# Patient Record
Sex: Male | Born: 2000 | Race: White | Hispanic: No | Marital: Single | Smoking: Never smoker
Health system: Southern US, Community
[De-identification: ages and names within clinical notes are randomized; demographics above are authoritative.]

## PROBLEM LIST (undated history)

## (undated) ENCOUNTER — Encounter

## (undated) ENCOUNTER — Telehealth

## (undated) ENCOUNTER — Ambulatory Visit

## (undated) ENCOUNTER — Encounter: Attending: Medical | Primary: Medical

## (undated) ENCOUNTER — Ambulatory Visit: Payer: PRIVATE HEALTH INSURANCE

## (undated) ENCOUNTER — Encounter: Attending: Gastroenterology | Primary: Gastroenterology

## (undated) ENCOUNTER — Ambulatory Visit: Payer: PRIVATE HEALTH INSURANCE | Attending: Otolaryngology | Primary: Otolaryngology

## (undated) DIAGNOSIS — F458 Other somatoform disorders: Secondary | ICD-10-CM

## (undated) DIAGNOSIS — G40109 Localization-related (focal) (partial) symptomatic epilepsy and epileptic syndromes with simple partial seizures, not intractable, without status epilepticus: Secondary | ICD-10-CM

## (undated) DIAGNOSIS — R011 Cardiac murmur, unspecified: Secondary | ICD-10-CM

## (undated) DIAGNOSIS — R569 Unspecified convulsions: Secondary | ICD-10-CM

## (undated) DIAGNOSIS — K219 Gastro-esophageal reflux disease without esophagitis: Secondary | ICD-10-CM

## (undated) DIAGNOSIS — S62609A Fracture of unspecified phalanx of unspecified finger, initial encounter for closed fracture: Secondary | ICD-10-CM

## (undated) HISTORY — PX: ESOPHAGOGASTRODUODENOSCOPY ENDOSCOPY: SHX5814

## (undated) HISTORY — DX: Unspecified convulsions: R56.9

---
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
## 2018-04-16 MED ORDER — FLUTICASONE PROPIONATE (BULK) 100 % POWDER
11 refills | 0 days
Start: 2018-04-16 — End: ?

## 2018-08-02 ENCOUNTER — Encounter: Admit: 2018-08-02 | Discharge: 2018-08-02 | Payer: PRIVATE HEALTH INSURANCE

## 2018-08-02 DIAGNOSIS — R1310 Dysphagia, unspecified: Principal | ICD-10-CM

## 2018-08-02 MED ORDER — LIDOCAINE HCL 2 % MUCOSAL SOLUTION
Freq: Four times a day (QID) | OROMUCOSAL | 0 refills | 0 days | Status: CP | PRN
Start: 2018-08-02 — End: ?

## 2018-11-01 MED ORDER — OMEPRAZOLE 20 MG CAPSULE,DELAYED RELEASE
ORAL_CAPSULE | 3 refills | 0 days | Status: CP
Start: 2018-11-01 — End: ?

## 2018-11-15 ENCOUNTER — Ambulatory Visit: Admit: 2018-11-15 | Discharge: 2018-11-15 | Payer: PRIVATE HEALTH INSURANCE

## 2018-11-15 ENCOUNTER — Encounter
Admit: 2018-11-15 | Discharge: 2018-11-15 | Payer: PRIVATE HEALTH INSURANCE | Attending: Anesthesiology | Primary: Anesthesiology

## 2018-11-15 DIAGNOSIS — R1010 Upper abdominal pain, unspecified: Principal | ICD-10-CM

## 2018-11-15 DIAGNOSIS — K200 Eosinophilic esophagitis: Principal | ICD-10-CM

## 2018-11-15 DIAGNOSIS — R1310 Dysphagia, unspecified: Principal | ICD-10-CM

## 2018-11-15 DIAGNOSIS — K219 Gastro-esophageal reflux disease without esophagitis: Principal | ICD-10-CM

## 2018-11-15 DIAGNOSIS — K222 Esophageal obstruction: Principal | ICD-10-CM

## 2018-11-15 DIAGNOSIS — G40909 Epilepsy, unspecified, not intractable, without status epilepticus: Principal | ICD-10-CM

## 2018-11-20 DIAGNOSIS — G40909 Epilepsy, unspecified, not intractable, without status epilepticus: Principal | ICD-10-CM

## 2018-11-20 DIAGNOSIS — K222 Esophageal obstruction: Principal | ICD-10-CM

## 2018-11-20 DIAGNOSIS — R1010 Upper abdominal pain, unspecified: Principal | ICD-10-CM

## 2018-11-20 DIAGNOSIS — K219 Gastro-esophageal reflux disease without esophagitis: Principal | ICD-10-CM

## 2018-11-20 DIAGNOSIS — K200 Eosinophilic esophagitis: Principal | ICD-10-CM

## 2019-03-01 IMAGING — US US ABDOMEN COMPLETE
1 series · 14 of 25 positions shown · non-contrast
Comparison: None.

CLINICAL DATA: Mid umbilical pain abdominal pain

EXAM:
ABDOMEN ULTRASOUND COMPLETE

[Series 1: us abdomen complete · 0.14mm/px · 14 of 81 slices shown]
[im 1/81]
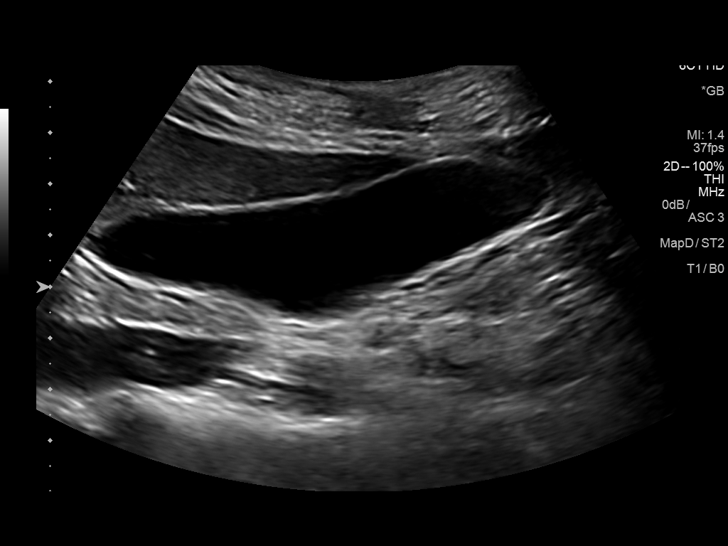
[im 7/81]
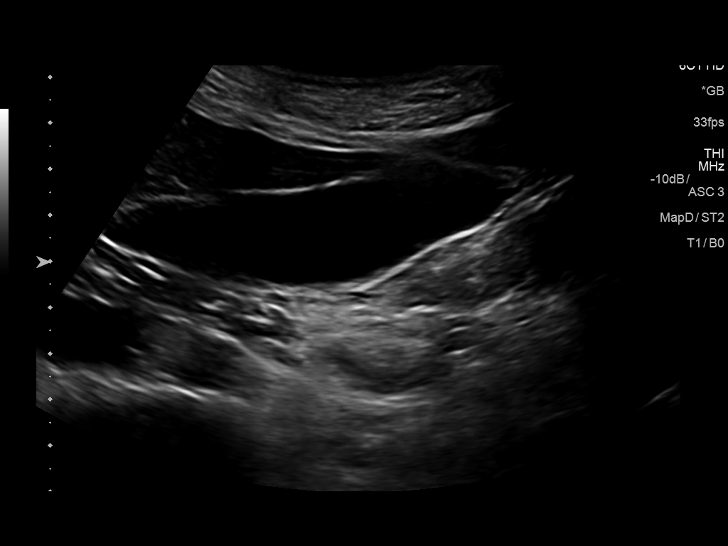
[im 14/81]
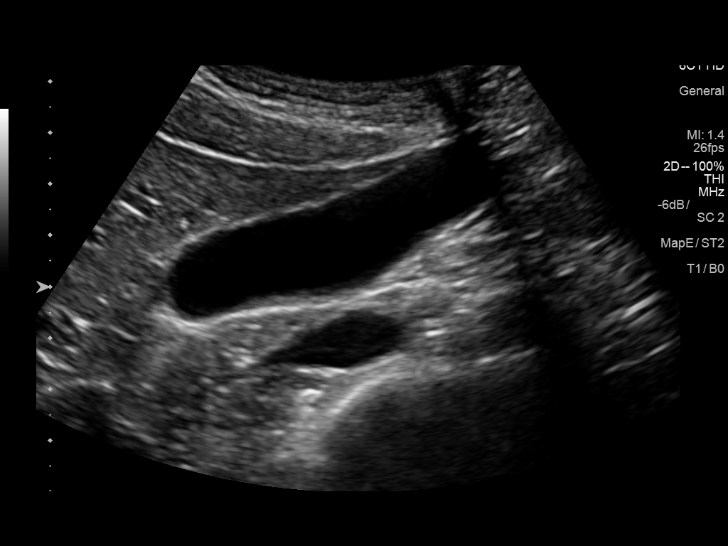
[im 21/81]
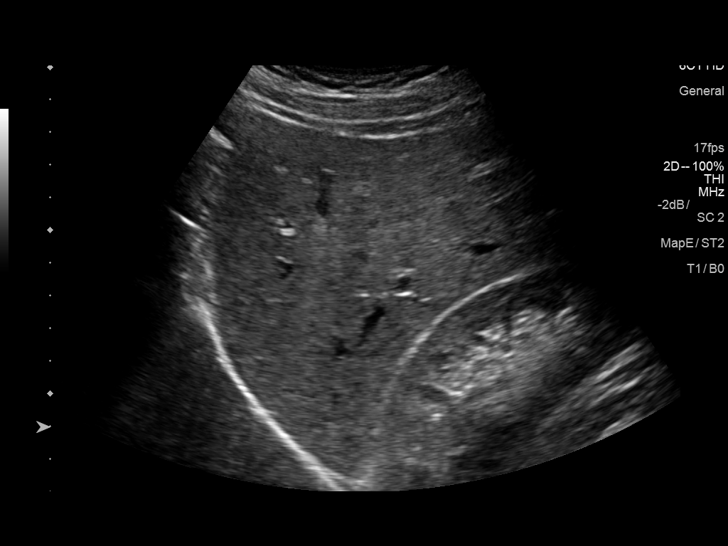
[im 27/81]
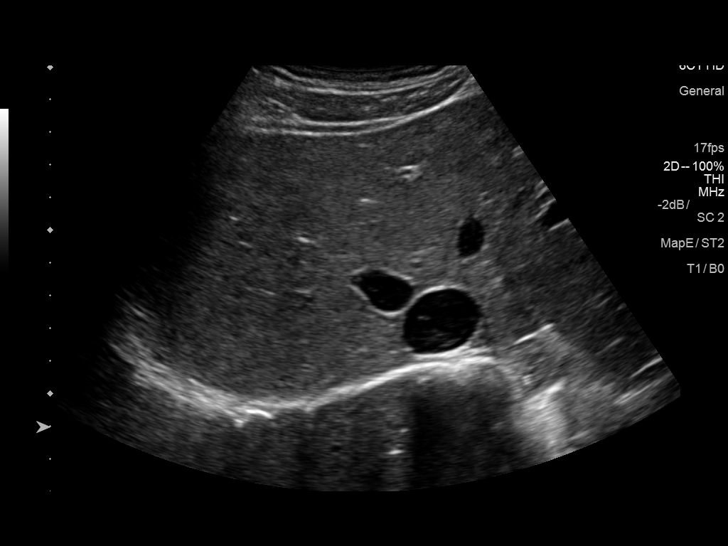
[im 31/81]
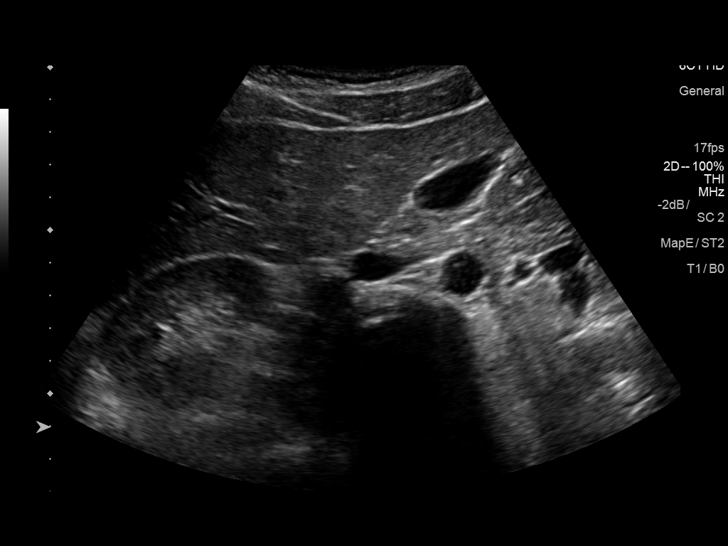
[im 37/81]
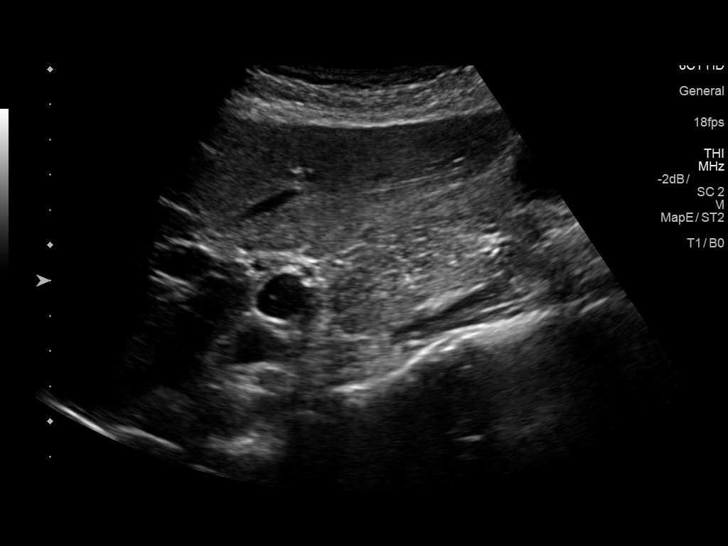
[im 44/81]
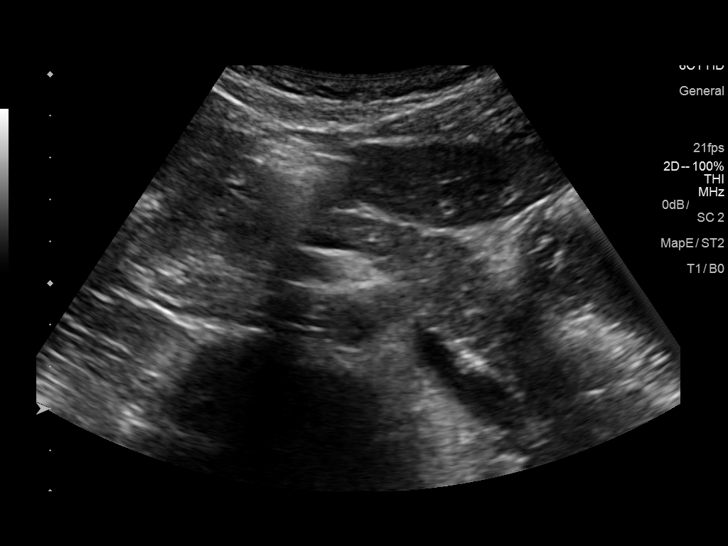
[im 51/81]
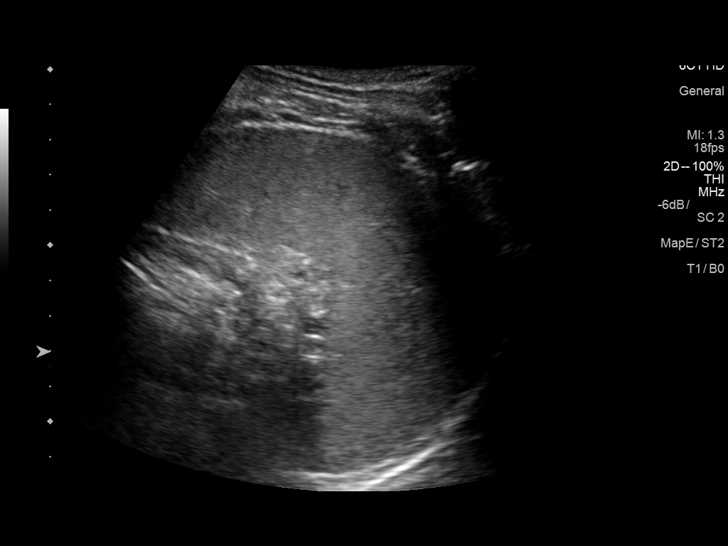
[im 54/81]
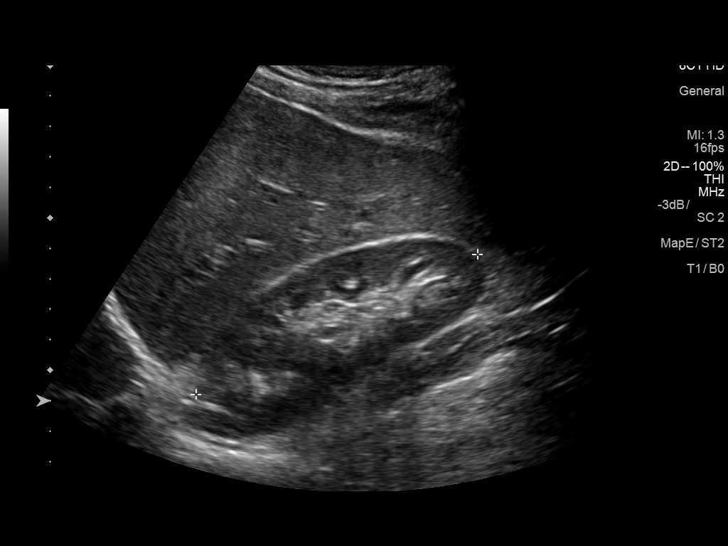
[im 61/81]
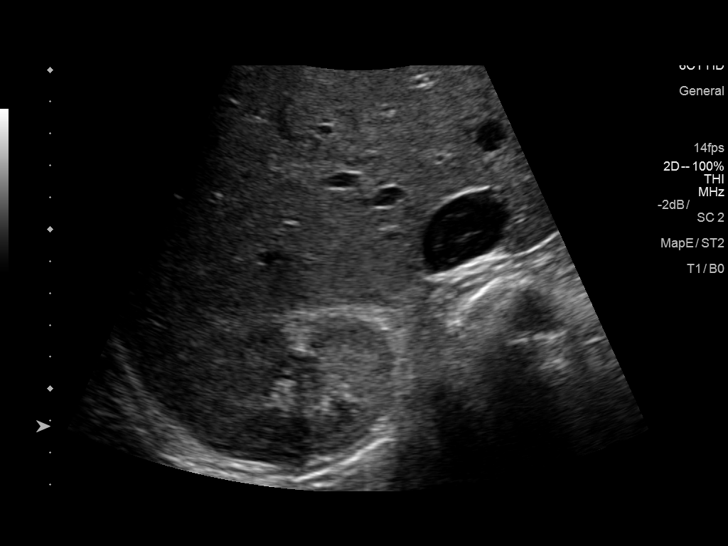
[im 67/81]
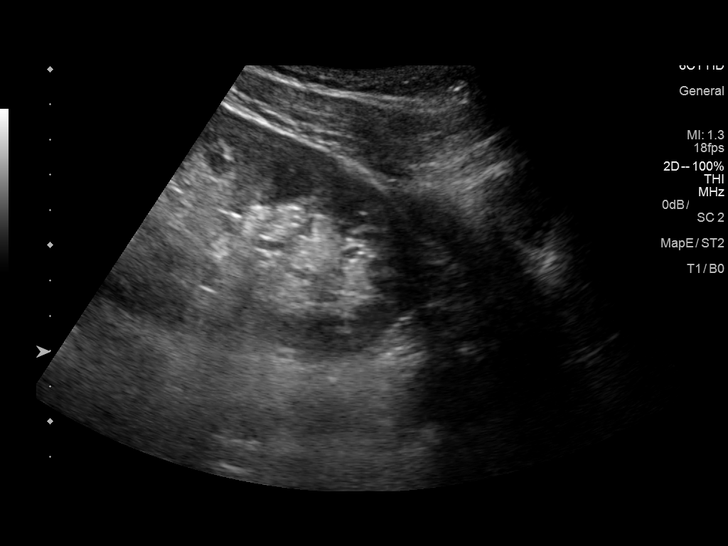
[im 74/81]
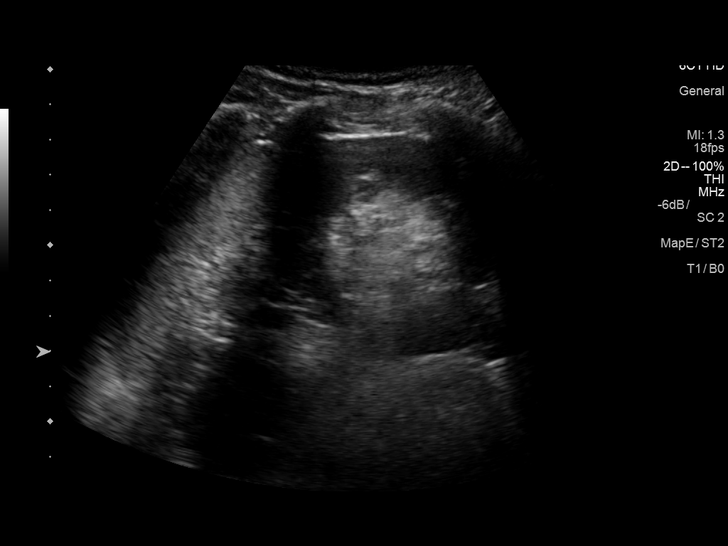
[im 81/81]
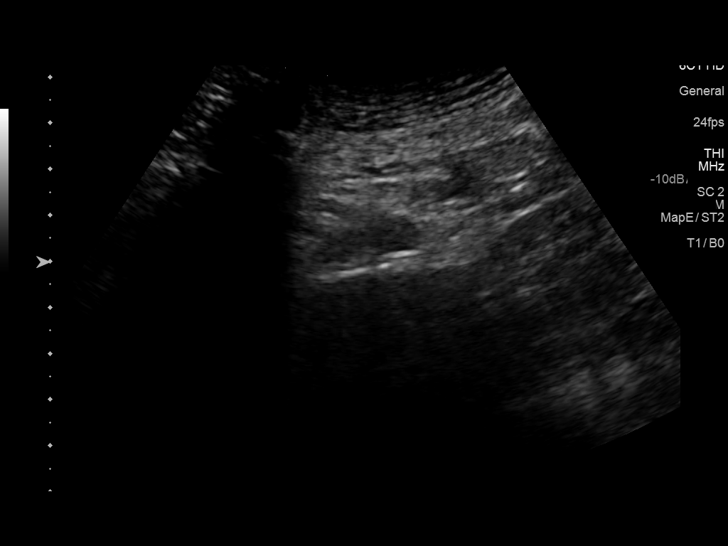

[14 of 25 positions shown; findings below may reference images not displayed]

FINDINGS: Gallbladder: No gallstones or wall thickening visualized. No
sonographic Murphy sign noted by sonographer.

Common bile duct: Diameter: Normal caliber, 3 mm

Liver: No focal lesion identified. Within normal limits in
parenchymal echogenicity. Is

IVC: No abnormality visualized.

Pancreas: Visualized portion unremarkable.

Spleen: Size and appearance within normal limits.

Right Kidney: Length: 11.0 cm. Echogenicity within normal limits. No
mass or hydronephrosis visualized.

Left Kidney: Length: 12.0 cm. Echogenicity within normal limits. No
mass or hydronephrosis visualized.

Abdominal aorta: No aneurysm visualized.

Other findings: None.
IMPRESSION: Unremarkable abdominal ultrasound.

## 2020-10-14 ENCOUNTER — Encounter: Admit: 2020-10-14 | Discharge: 2020-10-15 | Payer: PRIVATE HEALTH INSURANCE

## 2020-10-14 DIAGNOSIS — R1310 Dysphagia, unspecified: Principal | ICD-10-CM

## 2020-10-14 DIAGNOSIS — K200 Eosinophilic esophagitis: Principal | ICD-10-CM

## 2020-10-14 DIAGNOSIS — K222 Esophageal obstruction: Principal | ICD-10-CM

## 2021-02-11 ENCOUNTER — Encounter: Admit: 2021-02-11 | Discharge: 2021-02-11 | Payer: PRIVATE HEALTH INSURANCE

## 2021-02-16 DIAGNOSIS — K200 Eosinophilic esophagitis: Principal | ICD-10-CM

## 2021-02-16 DIAGNOSIS — J387 Other diseases of larynx: Principal | ICD-10-CM

## 2021-02-16 DIAGNOSIS — R1310 Dysphagia, unspecified: Principal | ICD-10-CM

## 2021-02-16 DIAGNOSIS — K222 Esophageal obstruction: Principal | ICD-10-CM

## 2021-02-16 MED ORDER — FLUTICASONE PROPIONATE (BULK) 100 % POWDER
0 refills | 0.00000 days
Start: 2021-02-16 — End: ?

## 2021-09-23 ENCOUNTER — Encounter: Admit: 2021-09-23 | Discharge: 2021-09-23 | Payer: PRIVATE HEALTH INSURANCE

## 2021-09-23 ENCOUNTER — Ambulatory Visit: Admit: 2021-09-23 | Discharge: 2021-09-23 | Payer: PRIVATE HEALTH INSURANCE

## 2021-10-06 DIAGNOSIS — K200 Eosinophilic esophagitis: Principal | ICD-10-CM

## 2021-10-06 DIAGNOSIS — K222 Esophageal obstruction: Principal | ICD-10-CM

## 2021-10-06 DIAGNOSIS — R1310 Dysphagia, unspecified: Principal | ICD-10-CM

## 2021-10-12 DIAGNOSIS — K200 Eosinophilic esophagitis: Principal | ICD-10-CM

## 2021-10-12 DIAGNOSIS — R1310 Dysphagia, unspecified: Principal | ICD-10-CM

## 2021-10-12 MED ORDER — DUPIXENT 300 MG/2 ML SUBCUTANEOUS PEN INJECTOR
SUBCUTANEOUS | 11 refills | 0.00000 days | Status: CP
Start: 2021-10-12 — End: 2022-10-12
  Filled 2021-10-20: qty 8, 28d supply, fill #0

## 2021-10-13 DIAGNOSIS — K200 Eosinophilic esophagitis: Principal | ICD-10-CM

## 2021-10-13 DIAGNOSIS — R1310 Dysphagia, unspecified: Principal | ICD-10-CM

## 2021-10-19 MED ORDER — EMPTY CONTAINER
5 refills | 0 days
Start: 2021-10-19 — End: ?

## 2021-10-19 NOTE — Unmapped (Signed)
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  I am speaking to the patient.    Was a Nurse, learning disability used for this call? No    Verified patient's date of birth / HIPAA.    Specialty medication(s) to be sent: Inflammatory Disorders: Dupixent      Non-specialty medications/supplies to be sent: sharps container      Medications not needed at this time: n/a         Dupixent (dupilumab)    Medication & Administration     Dosage: Eosinophilic esophagitis : Inject 161 mg under the skin every 7 days    Administration:     Dupixent Pen  1. Gather all supplies needed for injection on a clean, flat working surface: medication syringe removed from packaging, alcohol swab, sharps container, etc.  2. Look at the medication label - look for correct medication, correct dose, and check the expiration date  3. Look at the medication - the liquid in the pen should appear clear and colorless to pale yellow  4. Lay the pen on a flat surface and allow it to warm up to room temperature for at least 45 minutes  5. Select injection site - you can use the front of your thigh or your belly (but not the area 2 inches around your belly button); if someone else is giving you the injection you can also use your upper arm in the skin covering your triceps muscle  6. Prepare injection site - wash your hands and clean the skin at the injection site with an alcohol swab and let it air dry, do not touch the injection site again before the injection  7. Hold the middle of the body of the pen and gently pull the needle safety cap straight out. Be careful not to bend the needle. Do not remove until immediately prior to injection  8. Press the pen down onto the injection site at a 90 degree angle.   9. You will hear a click as the injection starts, and then a second click when the injection is ALMOST done. Keep holding the pen against the skin for 5 more seconds after the second click.   10. Check that the pen is empty by looking in the viewing window - the yellow indicator bar should be stopped, and should fill the window.   11. Remove the pen from the skin by lifting straight up.   12. Dispose of the used pen immediately in your sharps disposal container  13. If you see any blood at the injection site, press a cotton ball or gauze on the site and maintain pressure until the bleeding stops, do not rub the injection site    Adherence/Missed dose instructions:  If a dose is missed, administer within 7 days from the missed dose and then resume the original schedule. If the missed dose is not administered within 7 days, you can either wait until the next dose on the original schedule or take your dose now and resume every 14 days from the new injection date. Do not use 2 doses at the same time or extra doses.      Goals of Therapy     -reduce symptoms of dysphagia  -decrease in esophageal inflammation  -disease remission    Side Effects & Monitoring Parameters     ??? Injection site reaction (redness, irritation,  inflammation localized to the site of administration)  ??? Signs of a common cold - minor sore throat, runny or stuffy nose, etc.  ??? Recurrence of cold sores (herpes simplex)      The following side effects should be reported to the provider:  ??? Signs of a hypersensitivity reaction - rash; hives; itching; red, swollen, blistered, or peeling skin; wheezing; tightness in the chest or throat; difficulty breathing, swallowing, or talking; swelling of the mouth, face, lips, tongue, or throat; etc.  ??? Eye pain or irritation or any visual disturbances  ??? Shortness of breath or worsening of breathing      Contraindications, Warnings, & Precautions     ??? Have your bloodwork checked as you have been told by your prescriber   ??? Birth control pills and other hormone-based birth control may not work as well to prevent pregnancy  ??? Talk with your doctor if you are pregnant, planning to become pregnant, or breastfeeding  ??? Discuss the possible need for holding your dose(s) of Dupixent?? when a planned procedure is scheduled with the prescriber as it may delay healing/recovery timeline       Drug/Food Interactions     ??? Medication list reviewed in Epic. The patient was instructed to inform the care team before taking any new medications or supplements. No drug interactions identified.   ??? Talk with you prescriber or pharmacist before receiving any live vaccinations while taking this medication and after you stop taking it    Storage, Handling Precautions, & Disposal     ??? Store this medication in the refrigerator.  Do not freeze  ??? If needed, you may store at room temperature for up to 14 days  ??? Store in original packaging, protected from light  ??? Do not shake  ??? Dispose of used syringes in a sharps disposal container              Current Medications (including OTC/herbals), Comorbidities and Allergies     Current Outpatient Medications   Medication Sig Dispense Refill   ??? clonazePAM (KLONOPIN) 0.5 MG tablet Take 0.5 mg by mouth Three (3) times a day.     ??? dupilumab (DUPIXENT PEN) 300 mg/2 mL PnIj Inject the contents of 1 pen (300 mg) under the skin every seven (7) days. 8 mL 11   ??? empty container Misc Used as directed 1 each 5   ??? fluticasone propionate, bulk, 100 % Powd 4mg  = 32 mL QHS as directed 1 g 0   ??? lidocaine 2% viscous (XYLOCAINE) 2 % Soln 5-10 mL by Mouth route every six (6) hours as needed. 1 Bottle 0   ??? omeprazole (PRILOSEC) 20 MG capsule TAKE 1 CAPSULE(20 MG) BY MOUTH TWICE DAILY 180 capsule 3   ??? OXcarbazepine (TRILEPTAL) 300 MG tablet Take 300 mg by mouth Two (2) times a day.     ??? ZONISAMIDE ORAL Take 400 mg by mouth.       No current facility-administered medications for this visit.       No Known Allergies    Patient Active Problem List   Diagnosis   (none) - all problems resolved or deleted       Reviewed and up to date in Epic.    Appropriateness of Therapy     Acute infections noted within Epic:  No active infections  Patient reported infection: None    Is medication and dose appropriate based on diagnosis and infection status? Yes    Prescription has  been clinically reviewed: Yes      Baseline Quality of Life Assessment      How many days over the past month did your EoE  keep you from your normal activities? For example, brushing your teeth or getting up in the morning. 0    Financial Information     Medication Assistance provided: Copay Assistance    Anticipated copay of $0 /  reviewed with patient. Verified delivery address.    Delivery Information     Scheduled delivery date: 10/21/21    Expected start date: 10/21/21    Medication will be delivered via UPS to the prescription address in [facility].  This shipment will not require a signature.      Explained the services we provide at [facility] and that each month we would call to set up refills.  Stressed importance of returning phone calls so that we could ensure they receive their medications in time each month.  Informed patient that we should be setting up refills 7-10 days prior to when they will run out of medication.  A pharmacist will reach out to perform a clinical assessment periodically.  Informed patient that a welcome packet, containing information about our pharmacy and other support services, a Notice of Privacy Practices, and a drug information handout will be sent.      The patient or caregiver noted above participated in the development of this care plan and knows that they can request review of or adjustments to the care plan at any time.      Patient or caregiver verbalized understanding of the above information as well as how to contact the pharmacy at [phone] option 4 with any questions/concerns.  The pharmacy is open Monday through Friday 8:30am-4:30pm.  A pharmacist is available 24/7 via pager to answer any clinical questions they may have.    Patient Specific Needs     - Does the patient have any physical, cognitive, or cultural barriers? No    - Does the patient have adequate living arrangements? (i.e. the ability to store and take their medication appropriately) Yes    - Did you identify any home environmental safety or security hazards? No    - Patient prefers to have medications discussed with  Patient     - Is the patient or caregiver able to read and understand education materials at a high school level or above? Yes    - Patient's primary language is  English     - Is the patient high risk? No    SOCIAL DETERMINANTS OF HEALTH     At the [facility], we have learned that life circumstances - like trouble affording food, housing, utilities, or transportation can affect the health of many of our patients.   That is why we wanted to ask: are you currently experiencing any life circumstances that are negatively impacting your health and/or quality of life? Patient declined to answer    Social Determinants of Health     Food Insecurity: Not on file   Tobacco Use: Low Risk    ??? Smoking Tobacco Use: Never   ??? Smokeless Tobacco Use: Never   ??? Passive Exposure: Not on file   Transportation Needs: Not on file   Alcohol Use: Not on file   Housing/Utilities: Not on file   Substance Use: Not on file   Financial Resource Strain: Not on file   Physical Activity: Not on file   Health Literacy: Not  on file   Stress: Not on file   Intimate Partner Violence: Not on file   Depression: Not on file   Social Connections: Not on file       Would you be willing to receive help with any of the needs that you have identified today? Not applicable       Oliva Bustard  [facility] Specialty Pharmacist

## 2021-10-19 NOTE — Unmapped (Addendum)
[facility] Specialty Medication Onboarding    Specialty Medication: Dupixent pen 300mg /54ml  Prior Authorization: Approved   Financial Assistance: Yes - copay card approved as secondary   Final Copay/Day Supply: 0/ 28 days    Insurance Restrictions: Yes - max 1 month supply     Notes to Pharmacist: Maps sent pt mychart message about copay card option.    The triage team has completed the benefits investigation and has determined that the patient is able to fill this medication at [facility]. Please contact the patient to complete the onboarding or follow up with the prescribing physician as needed.

## 2021-10-20 MED FILL — EMPTY CONTAINER: 90 days supply | Qty: 1 | Fill #0

## 2021-11-05 NOTE — Unmapped (Signed)
[facility] Clinical Assessment & Refill Coordination Note    Brett Anderson, [place]: [birth year]  Phone: [phone] (home) [phone] (work)    All above HIPAA information was verified with patient.     Was a Nurse, learning disability used for this call? No    Specialty Medication(s):   Inflammatory Disorders: Dupixent     Current Outpatient Medications   Medication Sig Dispense Refill   ??? clonazePAM (KLONOPIN) 0.5 MG tablet Take 0.5 mg by mouth Three (3) times a day.     ??? dupilumab (DUPIXENT PEN) 300 mg/2 mL PnIj Inject the contents of 1 pen (300 mg) under the skin every seven (7) days. 8 mL 11   ??? empty container Misc Used as directed 1 each 5   ??? fluticasone propionate, bulk, 100 % Powd 4mg  = 32 mL QHS as directed 1 g 0   ??? lidocaine 2% viscous (XYLOCAINE) 2 % Soln 5-10 mL by Mouth route every six (6) hours as needed. 1 Bottle 0   ??? omeprazole (PRILOSEC) 20 MG capsule TAKE 1 CAPSULE(20 MG) BY MOUTH TWICE DAILY 180 capsule 3   ??? OXcarbazepine (TRILEPTAL) 300 MG tablet Take 300 mg by mouth Two (2) times a day.     ??? ZONISAMIDE ORAL Take 400 mg by mouth.       No current facility-administered medications for this visit.        Changes to medications: Nagi reports no changes at this time.    No Known Allergies    Changes to allergies: No    SPECIALTY MEDICATION ADHERENCE     Dupixent 300 mg/35mL: 10 days of medicine on hand     Medication Adherence    Patient reported X missed doses in the last month: 0  Specialty Medication: Dupixent 300 mg/2mL - every 7 days  Patient is on additional specialty medications: No  Informant: patient          Specialty medication(s) dose(s) confirmed: Regimen is correct and unchanged.     Are there any concerns with adherence? No    Adherence counseling provided? Not needed    CLINICAL MANAGEMENT AND INTERVENTION      Clinical Benefit Assessment:    Do you feel the medicine is effective or helping your condition? No - has not noticed any improvement with dysphagia yet    Clinical Benefit counseling provided? Reasonable expectations discussed: I discussed that it is reasonable that he has noticed improvement yet since he only received 2 doses. I enocuraged him to continue to monitor for improvement over the next month or two.     Adverse Effects Assessment:    Are you experiencing any side effects? No    Are you experiencing difficulty administering your medicine? No    Quality of Life Assessment:    Quality of Life    Rheumatology  Oncology  Dermatology  Cystic Fibrosis          How many days over the past month did your EoE  keep you from your normal activities? For example, brushing your teeth or getting up in the morning. Patient declined to answer    Have you discussed this with your provider? Not needed    Acute Infection Status:    Acute infections noted within Epic:  No active infections  Patient reported infection: None    Therapy Appropriateness:    Is therapy appropriate and patient progressing towards therapeutic goals? Yes, therapy is appropriate and should be continued  DISEASE/MEDICATION-SPECIFIC INFORMATION      For patients on injectable medications: Patient currently has 2 doses left.  Next injection is scheduled for 2/27.    PATIENT SPECIFIC NEEDS     - Does the patient have any physical, cognitive, or cultural barriers? No    - Is the patient high risk? No    - Does the patient require a Care Management Plan? No     SOCIAL DETERMINANTS OF HEALTH     At the [facility], we have learned that life circumstances - like trouble affording food, housing, utilities, or transportation can affect the health of many of our patients.   That is why we wanted to ask: are you currently experiencing any life circumstances that are negatively impacting your health and/or quality of life? Patient declined to answer    Social Determinants of Health     Food Insecurity: Not on file   Tobacco Use: Low Risk    ??? Smoking Tobacco Use: Never   ??? Smokeless Tobacco Use: Never   ??? Passive Exposure: Not on file   Transportation Needs: Not on file   Alcohol Use: Not on file   Housing/Utilities: Not on file   Substance Use: Not on file   Financial Resource Strain: Not on file   Physical Activity: Not on file   Health Literacy: Not on file   Stress: Not on file   Intimate Partner Violence: Not on file   Depression: Not on file   Social Connections: Not on file       Would you be willing to receive help with any of the needs that you have identified today? Not applicable       SHIPPING     Specialty Medication(s) to be Shipped:   Inflammatory Disorders: Dupixent    Other medication(s) to be shipped: No additional medications requested for fill at this time     Changes to insurance: No    Delivery Scheduled: Yes, Expected medication delivery date: 11/12/21.     Medication will be delivered via UPS to the confirmed prescription address in [facility].    The patient will receive a drug information handout for each medication shipped and additional FDA Medication Guides as required.  Verified that patient has previously received a Conservation officer, historic buildings and a Surveyor, mining.    The patient or caregiver noted above participated in the development of this care plan and knows that they can request review of or adjustments to the care plan at any time.      All of the patient's questions and concerns have been addressed.    Oliva Bustard   [facility] Specialty Pharmacist

## 2021-11-11 MED FILL — DUPIXENT 300 MG/2 ML SUBCUTANEOUS PEN INJECTOR: SUBCUTANEOUS | 28 days supply | Qty: 8 | Fill #1

## 2021-12-01 NOTE — Unmapped (Signed)
[facility] Refill Coordination Note    Specialty Medication(s) to be Shipped:   Inflammatory Disorders: Dupixent    Other medication(s) to be shipped: No additional medications requested for fill at this time     Brett Anderson, DOB: 2000-10-14  Phone: [phone] (home) [phone] (work)      All above HIPAA information was verified with patient.     Was a Nurse, learning disability used for this call? No    Completed refill call assessment today to schedule patient's medication shipment from the [facility] [phone]).  All relevant notes have been reviewed.     Specialty medication(s) and dose(s) confirmed: Regimen is correct and unchanged.   Changes to medications: Astor reports no changes at this time.  Changes to insurance: No  New side effects reported not previously addressed with a pharmacist or physician: None reported  Questions for the pharmacist: No    Confirmed patient received a Conservation officer, historic buildings and a Surveyor, mining with first shipment. The patient will receive a drug information handout for each medication shipped and additional FDA Medication Guides as required.       DISEASE/MEDICATION-SPECIFIC INFORMATION        For patients on injectable medications: Patient currently has 2 doses left.  Next injection is scheduled for 12/06/21 & 12/13/21.    SPECIALTY MEDICATION ADHERENCE     Medication Adherence    Patient reported X missed doses in the last month: 0  Specialty Medication: DUPIXENT PEN 300 mg/2 mL  Patient is on additional specialty medications: No  Patient is on more than two specialty medications: No  Any gaps in refill history greater than 2 weeks in the last 3 months: no  Demonstrates understanding of importance of adherence: yes  Informant: patient  Reliability of informant: reliable  Confirmed plan for next specialty medication refill: delivery by pharmacy  Refills needed for supportive medications: not needed              Were doses missed due to medication being on hold? No    DUPIXENT PEN 300 mg/2 mL : 14 days of medicine on hand       REFERRAL TO PHARMACIST     Referral to the pharmacist: Not needed      [facility]     Shipping address confirmed in Epic.     Delivery Scheduled: Yes, Expected medication delivery date: 12/16/21.     Medication will be delivered via UPS to the prescription address in Epic WAM.    Yolonda Kida   [facility] Specialty Technician

## 2021-12-15 MED FILL — DUPIXENT 300 MG/2 ML SUBCUTANEOUS PEN INJECTOR: SUBCUTANEOUS | 28 days supply | Qty: 8 | Fill #2

## 2021-12-30 NOTE — Unmapped (Signed)
[facility] Refill Coordination Note    Specialty Medication(s) to be Shipped:   Inflammatory Disorders: Dupixent    Other medication(s) to be shipped: No additional medications requested for fill at this time     Brett Anderson, DOB: 11/07/2000  Phone: [phone] (home) [phone] (work)      All above HIPAA information was verified with patient.     Was a Nurse, learning disability used for this call? No    Completed refill call assessment today to schedule patient's medication shipment from the [facility] [phone]).  All relevant notes have been reviewed.     Specialty medication(s) and dose(s) confirmed: Regimen is correct and unchanged.   Changes to medications: [name] reports no changes at this time.  Changes to insurance: No  New side effects reported not previously addressed with a pharmacist or physician: None reported  Questions for the pharmacist: No    Confirmed patient received a Conservation officer, historic buildings and a Surveyor, mining with first shipment. The patient will receive a drug information handout for each medication shipped and additional FDA Medication Guides as required.       DISEASE/MEDICATION-SPECIFIC INFORMATION        For patients on injectable medications: Patient currently has 3 doses left.  Next injection is scheduled for 01/03/22.    SPECIALTY MEDICATION ADHERENCE     Medication Adherence    Patient reported X missed doses in the last month: 0  Specialty Medication: DUPIXENT PEN 300 mg/2 mL  Patient is on additional specialty medications: No              Were doses missed due to medication being on hold? No    Dupixent 300/2 mg/ml: 4 days of medicine on hand         REFERRAL TO PHARMACIST     Referral to the pharmacist: Not needed      [facility]     Shipping address confirmed in Epic.     Delivery Scheduled: Yes, Expected medication delivery date: 01/06/22.     Medication will be delivered via UPS to the prescription address in Epic WAM.    [name] Specialty Technician

## 2022-01-05 MED FILL — DUPIXENT 300 MG/2 ML SUBCUTANEOUS PEN INJECTOR: SUBCUTANEOUS | 28 days supply | Qty: 8 | Fill #3

## 2022-01-28 NOTE — Unmapped (Signed)
[facility] Refill Coordination Note    Specialty Medication(s) to be Shipped:   Inflammatory Disorders: Dupixent    Other medication(s) to be shipped: No additional medications requested for fill at this time     Brett Anderson, DOB: 08/12/2001  Phone: [phone] (home) [phone] (work)      All above HIPAA information was verified with patient.     Was a Nurse, learning disability used for this call? No    Completed refill call assessment today to schedule patient's medication shipment from the [facility] [phone]).  All relevant notes have been reviewed.     Specialty medication(s) and dose(s) confirmed: Regimen is correct and unchanged.   Changes to medications: [name] reports no changes at this time.  Changes to insurance: No  New side effects reported not previously addressed with a pharmacist or physician: None reported  Questions for the pharmacist: No    Confirmed patient received a Conservation officer, historic buildings and a Surveyor, mining with first shipment. The patient will receive a drug information handout for each medication shipped and additional FDA Medication Guides as required.       DISEASE/MEDICATION-SPECIFIC INFORMATION        For patients on injectable medications: Patient currently has 1 doses left.  Next injection is scheduled for 01/31/22.    SPECIALTY MEDICATION ADHERENCE     Medication Adherence    Patient reported X missed doses in the last month: 0  Specialty Medication: DUPIXENT PEN 300 mg/2 mL  Patient is on additional specialty medications: No              Were doses missed due to medication being on hold? No    Dupixent 300/2 mg/ml: 0 days of medicine on hand        REFERRAL TO PHARMACIST     Referral to the pharmacist: Not needed      [facility]     Shipping address confirmed in Epic.     Delivery Scheduled: Yes, Expected medication delivery date: 02/03/22.     Medication will be delivered via UPS to the prescription address in Epic WAM.    [name] Specialty Technician

## 2022-02-02 MED FILL — DUPIXENT 300 MG/2 ML SUBCUTANEOUS PEN INJECTOR: SUBCUTANEOUS | 28 days supply | Qty: 8 | Fill #4

## 2022-02-24 NOTE — Unmapped (Signed)
[facility] Refill Coordination Note    Specialty Medication(s) to be Shipped:   Inflammatory Disorders: Dupixent    Other medication(s) to be shipped: No additional medications requested for fill at this time     Brett Anderson, DOB: 07-24-2001  Phone: [phone] (home) [phone] (work)      All above HIPAA information was verified with patient.     Was a Nurse, learning disability used for this call? No    Completed refill call assessment today to schedule patient's medication shipment from the [facility] [phone]).  All relevant notes have been reviewed.     Specialty medication(s) and dose(s) confirmed: Regimen is correct and unchanged.   Changes to medications: Abhinav reports no changes at this time.  Changes to insurance: No  New side effects reported not previously addressed with a pharmacist or physician: None reported  Questions for the pharmacist: No    Confirmed patient received a Conservation officer, historic buildings and a Surveyor, mining with first shipment. The patient will receive a drug information handout for each medication shipped and additional FDA Medication Guides as required.       DISEASE/MEDICATION-SPECIFIC INFORMATION        For patients on injectable medications: Patient currently has 2 doses left.  Next injection is scheduled for 02/28/22.    SPECIALTY MEDICATION ADHERENCE     Medication Adherence    Patient reported X missed doses in the last month: 0  Specialty Medication: DUPIXENT PEN 300 mg/2 mL  Patient is on additional specialty medications: No              Were doses missed due to medication being on hold? No    Dupixent 300/2 mg/ml: 4 days of medicine on hand        REFERRAL TO PHARMACIST     Referral to the pharmacist: Not needed      [facility]     Shipping address confirmed in Epic.     Delivery Scheduled: Yes, Expected medication delivery date: 03/04/22.     Medication will be delivered via UPS to the prescription address in Epic WAM.    [name] [facility] Specialty Technician

## 2022-03-03 MED FILL — DUPIXENT 300 MG/2 ML SUBCUTANEOUS PEN INJECTOR: SUBCUTANEOUS | 28 days supply | Qty: 8 | Fill #5

## 2022-03-25 NOTE — Unmapped (Signed)
[facility] Refill Coordination Note    Specialty Medication(s) to be Shipped:   Inflammatory Disorders: Dupixent    Other medication(s) to be shipped: No additional medications requested for fill at this time     Brett Anderson, DOB: [birth year]  Phone: [phone] (home) [phone] (work)      All above HIPAA information was verified with patient.     Was a Nurse, learning disability used for this call? No    Completed refill call assessment today to schedule patient's medication shipment from the [facility] [phone]).  All relevant notes have been reviewed.     Specialty medication(s) and dose(s) confirmed: Regimen is correct and unchanged.   Changes to medications: [name] reports no changes at this time.  Changes to insurance: No  New side effects reported not previously addressed with a pharmacist or physician: None reported  Questions for the pharmacist: No    Confirmed patient received a Conservation officer, historic buildings and a Surveyor, mining with first shipment. The patient will receive a drug information handout for each medication shipped and additional FDA Medication Guides as required.       DISEASE/MEDICATION-SPECIFIC INFORMATION        For patients on injectable medications: Patient currently has 2 doses left.  Next injection is scheduled for 03/28/22.    SPECIALTY MEDICATION ADHERENCE     Medication Adherence    Patient reported X missed doses in the last month: 0  Specialty Medication: DUPIXENT PEN 300 mg/2 mL  Patient is on additional specialty medications: No              Were doses missed due to medication being on hold? No    Dupixent 300/2 mg/ml: 3 days of medicine on hand        REFERRAL TO PHARMACIST     Referral to the pharmacist: Not needed      [facility]     Shipping address confirmed in Epic.     Delivery Scheduled: Yes, Expected medication delivery date: 04/01/22.     Medication will be delivered via UPS to the prescription address in Epic WAM.    Brett Anderson [facility] Specialty Technician

## 2022-03-31 MED FILL — DUPIXENT 300 MG/2 ML SUBCUTANEOUS PEN INJECTOR: SUBCUTANEOUS | 28 days supply | Qty: 8 | Fill #6

## 2022-04-08 NOTE — Unmapped (Signed)
EGD  Procedure #1   0  Procedure #2   [id number]  MRN   Dellon   Endoscopist   FALSE  Is the patient's health insurance [employer], [facility]), or [employer]?   FALSE  Urgent procedure   FALSE  Do you take: Plavix (clopidogrel), Coumadin (warfarin), Lovenox (enoxaparin), Pradaxa (dabigatran), Effient (prasugrel), Xarelto (rivaroxaban), Eliquis (apixaban), Pletal (cilostazol), or Brilinta (ticagrelor)?   FALSE  Do you have hemophilia, von Willebrand disease, thrombocytopenia?   FALSE  Do you have a pacemaker or implanted cardiac defibrillator?   FALSE  Are you pregnant?   TRUE  Has a [place] [facility] provider specified the location(s)?     Which location(s) did the [facility] provider specify?   FALSE     [facility]   TRUE     [place]   FALSE     [facility]   FALSE     HMOB-Mod Sedation   FALSE  Is procedure indication for variceal banding (this does NOT include variceal screening)?        5  Height (feet)   10  Height (inches)   165  Weight (pounds)   23.7  BMI        FALSE  Did the ordering provider specify a bowel prep?   0       What bowel prep was specified?   FALSE  Do you have chronic kidney disease?   FALSE  Do you have chronic constipation or have you had poor quality bowel preps for past colonoscopies?   FALSE  Do you have Crohn's disease or ulcerative colitis?   FALSE  Have you had weight loss surgery?        FALSE  Are you in the process of scheduling or awaiting results of a heart ultrasound, stress test, or catheterization to evaluate new or worsening chest pain, dizziness, or shortness of breath?   FALSE  When you walk around your house or grocery store, do you have to stop and rest due to shortness of breath, chest pain, or light-headedness?   FALSE  Have you had a heart attack, stroke or heart stent placement within the past 6 months?   FALSE  Do you ever use supplemental oxygen?   FALSE  Have you been hospitalized for cirrhosis of the liver or heart failure in the last 12 months?   FALSE  Have you been treated for mouth or throat cancer with radiation or surgery?   FALSE  Have you been told that it is difficult for doctors to insert a breathing tube in you during anesthesia?   FALSE  Have you had a heart or lung transplant?        FALSE  Are you on dialysis?   FALSE  Do you have cirrhosis of the liver?   FALSE  Do you have myasthenia gravis?   FALSE  Is the patient a prisoner?        FALSE  Have you been diagnosed with sleep apnea or do you wear a CPAP machine at night?   TRUE  Are you younger than 30?   FALSE  Have you previously received propofol sedation administered by an anesthesiologist for a GI procedure?   FALSE  Do you drink an average of more than 3 drinks of alcohol per day?   FALSE  Do you regularly take suboxone or any prescription medications for chronic pain?   FALSE  Do you regularly take Ativan, Klonopin, Xanax, Valium, lorazepam, clonazepam,  alprazolam, or diazepam?   FALSE  Have you previously had difficulty with sedation during a GI procedure?   FALSE  Have you been diagnosed with PTSD?   FALSE  Are you allergic to fentanyl or midazolam (Versed)?   FALSE  Do you take medications for HIV?   ################### ###################################################################################################################   MRN:          [id number]   Anticoag Review:  No   Nurse Triage:  No   GI Clinic Consult:  No   Procedure(s):  EGD     0   [facility]s):  [facility]     [facility]      [place]          Endoscopist:  Dellon    Urgent:            No   Prep:                       ################### ###################################################################################################################

## 2022-04-22 NOTE — Unmapped (Signed)
[facility] Clinical Assessment & Refill Coordination Note    Brett Anderson, Brett Anderson: Apr 20, 2001  Phone: [phone] (home) [phone] (work)    All above HIPAA information was verified with patient.     Was a Nurse, learning disability used for this call? No    Specialty Medication(s):   Inflammatory Disorders: Dupixent     Current Outpatient Medications   Medication Sig Dispense Refill    clonazePAM (KLONOPIN) 0.5 MG tablet Take 0.5 mg by mouth Three (3) times a day.      dupilumab (DUPIXENT PEN) 300 mg/2 mL PnIj Inject the contents of 1 pen (300 mg) under the skin every seven (7) days. 8 mL 11    empty container Misc Used as directed 1 each 5    fluticasone propionate, bulk, 100 % Powd 4mg  = 32 mL QHS as directed 1 g 0    lidocaine 2% viscous (XYLOCAINE) 2 % Soln 5-10 mL by Mouth route every six (6) hours as needed. 1 Bottle 0    omeprazole (PRILOSEC) 20 MG capsule TAKE 1 CAPSULE(20 MG) BY MOUTH TWICE DAILY 180 capsule 3    OXcarbazepine (TRILEPTAL) 300 MG tablet Take 300 mg by mouth Two (2) times a day.      ZONISAMIDE ORAL Take 400 mg by mouth.       No current facility-administered medications for this visit.        Changes to medications: Brett Anderson reports no changes at this time.    No Known Allergies    Changes to allergies: No    SPECIALTY MEDICATION ADHERENCE     Dupixent 300  mg/63mL : 3 days of medicine on hand     Medication Adherence    Patient reported X missed doses in the last month: 0  Specialty Medication: Dupixent 300 mg/7mL weekly  Patient is on additional specialty medications: No  Patient is on more than two specialty medications: No  Any gaps in refill history greater than 2 weeks in the last 3 months: no  Demonstrates understanding of importance of adherence: yes  Informant: patient                            Specialty medication(s) dose(s) confirmed: Regimen is correct and unchanged.     Are there any concerns with adherence? No    Adherence counseling provided? Not needed    CLINICAL MANAGEMENT AND INTERVENTION      Clinical Benefit Assessment:    Do you feel the medicine is effective or helping your condition? Yes    Clinical Benefit counseling provided? Not needed    Adverse Effects Assessment:    Are you experiencing any side effects? No    Are you experiencing difficulty administering your medicine? No    Quality of Life Assessment:    Quality of Life    Rheumatology  Oncology  Dermatology  Cystic Fibrosis          How many days over the past month did your EoE  keep you from your normal activities? For example, brushing your teeth or getting up in the morning. 0 - Brett Anderson stated he did not have symptoms related to EoE prior to starting Dupixent.    Have you discussed this with your provider? Not needed    Acute Infection Status:    Acute infections noted within Epic:  No active infections  Patient reported infection: None    Therapy Appropriateness:  Is therapy appropriate and patient progressing towards therapeutic goals? Yes, therapy is appropriate and should be continued    DISEASE/MEDICATION-SPECIFIC INFORMATION      For patients on injectable medications: Patient currently has 1-2 doses left.  Next injection is scheduled for 8/14.    PATIENT SPECIFIC NEEDS     Does the patient have any physical, cognitive, or cultural barriers? No    Is the patient high risk? No    Does the patient require a Care Management Plan? No     SOCIAL DETERMINANTS OF HEALTH     At the [facility], we have learned that life circumstances - like trouble affording food, housing, utilities, or transportation can affect the health of many of our patients.   That is why we wanted to ask: are you currently experiencing any life circumstances that are negatively impacting your health and/or quality of life? No    Social Determinants of Psychologist, prison and probation services Strain: Not on file   Internet Connectivity: Not on file   Food Insecurity: Not on file   Tobacco Use: Low Risk  (09/26/2021) Patient History     Smoking Tobacco Use: Never     Smokeless Tobacco Use: Never     Passive Exposure: Not on file   Housing/Utilities: Not on file   Alcohol Use: Not on file   Transportation Needs: Not on file   Substance Use: Not on file   Health Literacy: Not on file   Physical Activity: Not on file   Interpersonal Safety: Not on file   Stress: Not on file   Intimate Partner Violence: Not on file   Depression: Not on file   Social Connections: Not on file       Would you be willing to receive help with any of the needs that you have identified today? Not applicable       SHIPPING     Specialty Medication(s) to be Shipped:   Inflammatory Disorders: Dupixent    Other medication(s) to be shipped: No additional medications requested for fill at this time     Changes to insurance: No    Delivery Scheduled: Yes, Expected medication delivery date: 04/26/22.     Medication will be delivered via UPS to the confirmed prescription address in [facility].    The patient will receive a drug information handout for each medication shipped and additional FDA Medication Guides as required.  Verified that patient has previously received a Conservation officer, historic buildings and a Surveyor, mining.    The patient or caregiver noted above participated in the development of this care plan and knows that they can request review of or adjustments to the care plan at any time.      All of the patient's questions and concerns have been addressed.    Brett Anderson   [facility] Specialty Pharmacist

## 2022-04-26 NOTE — Unmapped (Signed)
Brett Anderson 's DUPIXENT PEN 300 mg/2 mL Pnij (dupilumab) shipment will be delayed as a result of the medication is too soon to refill until 04/27/2022.     I have reached out to the patient  at [phone] and communicated the delivery change. We will reschedule the medication for the delivery date that the patient agreed upon.  We have confirmed the delivery date as 04/28/2022, via ups.

## 2022-04-27 MED FILL — DUPIXENT 300 MG/2 ML SUBCUTANEOUS PEN INJECTOR: SUBCUTANEOUS | 28 days supply | Qty: 8 | Fill #7

## 2022-05-19 NOTE — Unmapped (Signed)
[facility] Refill Coordination Note    Specialty Medication(s) to be Shipped:   Inflammatory Disorders: Dupixent    Other medication(s) to be shipped: No additional medications requested for fill at this time     Brett Anderson, DOB: 10-07-2000  Phone: [phone] (home) [phone] (work)      All above HIPAA information was verified with patient.     Was a Nurse, learning disability used for this call? No    Completed refill call assessment today to schedule patient's medication shipment from the [facility] [phone]).  All relevant notes have been reviewed.     Specialty medication(s) and dose(s) confirmed: Regimen is correct and unchanged.   Changes to medications: [name] reports no changes at this time.  Changes to insurance: No  New side effects reported not previously addressed with a pharmacist or physician: None reported  Questions for the pharmacist: No    Confirmed patient received a Conservation officer, historic buildings and a Surveyor, mining with first shipment. The patient will receive a drug information handout for each medication shipped and additional FDA Medication Guides as required.       DISEASE/MEDICATION-SPECIFIC INFORMATION        For patients on injectable medications: Patient currently has 1 doses left.  Next injection is scheduled for 05/20/22.    SPECIALTY MEDICATION ADHERENCE     Medication Adherence    Patient reported X missed doses in the last month: 0  Specialty Medication: DUPIXENT PEN 300 mg/2 mL  Patient is on additional specialty medications: No                                Were doses missed due to medication being on hold? No      REFERRAL TO PHARMACIST     Referral to the pharmacist: Not needed      [facility]     Shipping address confirmed in Epic.     Delivery Scheduled: Yes, Expected medication delivery date: 05/25/22.     Medication will be delivered via UPS to the prescription address in Epic WAM.    Quintella Reichert   [facility] Specialty Technician

## 2022-05-24 NOTE — Unmapped (Signed)
Brett Anderson 's DUPIXENT PEN 300 mg/2 mL Pnij (dupilumab) shipment will be delayed as a result of the medication is too soon to refill until 05/25/2022.     I have reached out to the patient  at [phone] and left a voicemail message.  We will wait for a call back from the patient to reschedule the delivery.  We have not confirmed the new delivery date.

## 2022-05-24 NOTE — Unmapped (Signed)
Niven Cappuccio 's DUPIXENT PEN 300 mg/2 mL Pnij (dupilumab) shipment will be delivered on 05/26/22 via UPS

## 2022-05-25 MED FILL — DUPIXENT 300 MG/2 ML SUBCUTANEOUS PEN INJECTOR: SUBCUTANEOUS | 28 days supply | Qty: 8 | Fill #8

## 2022-06-10 MED ORDER — OMEPRAZOLE 20 MG CAPSULE,DELAYED RELEASE
ORAL_CAPSULE | Freq: Every day | ORAL | 3 refills | 90 days | Status: CP
Start: 2022-06-10 — End: ?

## 2022-06-14 ENCOUNTER — Other Ambulatory Visit: Payer: Self-pay | Admitting: Physician Assistant

## 2022-06-14 DIAGNOSIS — N5089 Other specified disorders of the male genital organs: Secondary | ICD-10-CM

## 2022-06-14 NOTE — Unmapped (Signed)
Received notification that omeprazole not covered without PA.  Submitting ePA request via MAP team.

## 2022-06-16 ENCOUNTER — Other Ambulatory Visit: Payer: BLUE CROSS/BLUE SHIELD

## 2022-06-17 NOTE — Unmapped (Signed)
[facility] Refill Coordination Note    Specialty Medication(s) to be Shipped:   Inflammatory Disorders: Dupixent    Other medication(s) to be shipped: No additional medications requested for fill at this time     Brett Anderson, DOB: Jan 21, 2001  Phone: [phone] (home) [phone] (work)      All above HIPAA information was verified with patient.     Was a Nurse, learning disability used for this call? No    Completed refill call assessment today to schedule patient's medication shipment from the [facility] [phone]).  All relevant notes have been reviewed.     Specialty medication(s) and dose(s) confirmed: Regimen is correct and unchanged.   Changes to medications: [name] reports no changes at this time.  Changes to insurance: No  New side effects reported not previously addressed with a pharmacist or physician: None reported  Questions for the pharmacist: No    Confirmed patient received a Conservation officer, historic buildings and a Surveyor, mining with first shipment. The patient will receive a drug information handout for each medication shipped and additional FDA Medication Guides as required.       DISEASE/MEDICATION-SPECIFIC INFORMATION        For patients on injectable medications: Patient currently has 2 doses left.  Next injection is scheduled for 10/12.    SPECIALTY MEDICATION ADHERENCE     Medication Adherence    Patient reported X missed doses in the last month: 0  Specialty Medication: DUPIXENT PEN 300 mg/2 mL  Patient is on additional specialty medications: No                          Were doses missed due to medication being on hold? No    Dupixent 300/2 mg/ml: 7 days of medicine on hand        REFERRAL TO PHARMACIST     Referral to the pharmacist: Not needed      [facility]     Shipping address confirmed in Epic.     Delivery Scheduled: Yes, Expected medication delivery date: 06/23/22.     Medication will be delivered via UPS to the prescription address in Epic WAM.    [name]   [facility] Specialty Technician

## 2022-06-20 ENCOUNTER — Ambulatory Visit
Admission: RE | Admit: 2022-06-20 | Discharge: 2022-06-20 | Disposition: A | Discharge status: Home / Self Care | Payer: BC Managed Care – PPO | Source: Ambulatory Visit | Attending: Physician Assistant | Admitting: Physician Assistant

## 2022-06-20 DIAGNOSIS — N5089 Other specified disorders of the male genital organs: Secondary | ICD-10-CM

## 2022-06-22 MED FILL — DUPIXENT 300 MG/2 ML SUBCUTANEOUS PEN INJECTOR: SUBCUTANEOUS | 28 days supply | Qty: 8 | Fill #9

## 2022-07-15 NOTE — Unmapped (Signed)
[facility] Refill Coordination Note    Specialty Medication(s) to be Shipped:   Inflammatory Disorders: Dupixent    Other medication(s) to be shipped: No additional medications requested for fill at this time     Brett Anderson, DOB: 06-May-2001  Phone: [phone] (home) [phone] (work)      All above HIPAA information was verified with patient.     Was a Nurse, learning disability used for this call? No    Completed refill call assessment today to schedule patient's medication shipment from the [facility] [phone]).  All relevant notes have been reviewed.     Specialty medication(s) and dose(s) confirmed: Regimen is correct and unchanged.   Changes to medications: Shain reports no changes at this time.  Changes to insurance: No  New side effects reported not previously addressed with a pharmacist or physician: None reported  Questions for the pharmacist: No    Confirmed patient received a Conservation officer, historic buildings and a Surveyor, mining with first shipment. The patient will receive a drug information handout for each medication shipped and additional FDA Medication Guides as required.       DISEASE/MEDICATION-SPECIFIC INFORMATION        For patients on injectable medications: Patient currently has 2 doses left.  Next injection is scheduled for 07/18/22 .    SPECIALTY MEDICATION ADHERENCE     Medication Adherence    Patient reported X missed doses in the last month: 0  Specialty Medication: DUPIXENT PEN 300 mg/2 mL  Patient is on additional specialty medications: No  Patient is on more than two specialty medications: No  Any gaps in refill history greater than 2 weeks in the last 3 months: no  Demonstrates understanding of importance of adherence: yes                          Were doses missed due to medication being on hold? No    Dupixent 300/2 mg/ml: 14 days of medicine on hand        REFERRAL TO PHARMACIST     Referral to the pharmacist: Not needed      [facility] Shipping address confirmed in Epic.     Delivery Scheduled: Yes, Expected medication delivery date: 07/21/22 .     Medication will be delivered via UPS to the prescription address in Epic WAM.    Ricci Barker   [facility] Specialty Technician

## 2022-07-20 MED FILL — DUPIXENT 300 MG/2 ML SUBCUTANEOUS PEN INJECTOR: SUBCUTANEOUS | 28 days supply | Qty: 8 | Fill #10

## 2022-08-11 ENCOUNTER — Ambulatory Visit: Admit: 2022-08-11 | Discharge: 2022-08-11 | Payer: PRIVATE HEALTH INSURANCE

## 2022-08-11 ENCOUNTER — Encounter: Admit: 2022-08-11 | Discharge: 2022-08-11 | Payer: PRIVATE HEALTH INSURANCE

## 2022-08-11 MED ADMIN — lactated Ringers infusion: 10 mL/h | INTRAVENOUS | @ 13:00:00 | Stop: 2022-08-11

## 2022-08-11 MED ADMIN — Propofol (DIPRIVAN) injection: INTRAVENOUS | @ 14:00:00 | Stop: 2022-08-11

## 2022-08-11 MED ADMIN — lidocaine (XYLOCAINE) 20 mg/mL (2 %) injection: INTRAVENOUS | @ 14:00:00 | Stop: 2022-08-11

## 2022-08-11 MED ADMIN — propofol (DIPRIVAN) infusion 10 mg/mL: INTRAVENOUS | @ 14:00:00 | Stop: 2022-08-11

## 2022-08-12 NOTE — Unmapped (Signed)
Per Dr. Dola Argyle request, provided updated GI records to Dr. Matthias Hughs at [facility] in [place], [place] (referring provider) for continuation of patient's care.    Provided via manual fax: [phone]  Provided initial clinic visit (2016) and all GI clinic visit, procedure notes and pathology reports as well as Dr. Dola Argyle telephone encounters from the last 2 years.  Also provided a summary of care provided 2016-2021, offering to individual reports, if desired.  Will provide updated pathology results and plan of care when those are available in the next couple weeks.

## 2022-08-17 NOTE — Unmapped (Signed)
[facility] Refill Coordination Note    Specialty Medication(s) to be Shipped:   Inflammatory Disorders: Dupixent    Other medication(s) to be shipped: No additional medications requested for fill at this time     Ladale Rinner, DOB: [birth year]  Phone: [phone] (home) [phone] (work)      All above HIPAA information was verified with patient.     Was a Nurse, learning disability used for this call? No    Completed refill call assessment today to schedule patient's medication shipment from the [facility] [phone]).  All relevant notes have been reviewed.     Specialty medication(s) and dose(s) confirmed: Regimen is correct and unchanged.   Changes to medications: Taiwo reports no changes at this time.  Changes to insurance: No  New side effects reported not previously addressed with a pharmacist or physician: None reported  Questions for the pharmacist: No    Confirmed patient received a Conservation officer, historic buildings and a Surveyor, mining with first shipment. The patient will receive a drug information handout for each medication shipped and additional FDA Medication Guides as required.       DISEASE/MEDICATION-SPECIFIC INFORMATION        For patients on injectable medications: Patient currently has 1 doses left.  Next injection is scheduled for 12/11.    SPECIALTY MEDICATION ADHERENCE     Medication Adherence    Patient reported X missed doses in the last month: 0  Specialty Medication: DUPIXENT PEN 300 mg/2 mL  Patient is on additional specialty medications: No                          Were doses missed due to medication being on hold? No    Dupixent 300/2 mg/ml: 5 days of medicine on hand        REFERRAL TO PHARMACIST     Referral to the pharmacist: Not needed      [facility]     Shipping address confirmed in Epic.     Delivery Scheduled: Yes, Expected medication delivery date: 08/25/22.     Medication will be delivered via UPS to the prescription address in Epic WAM.    [name]   [facility] Specialty Technician

## 2022-08-19 DIAGNOSIS — K200 Eosinophilic esophagitis: Principal | ICD-10-CM

## 2022-08-19 DIAGNOSIS — R1310 Dysphagia, unspecified: Principal | ICD-10-CM

## 2022-08-19 DIAGNOSIS — K222 Esophageal obstruction: Principal | ICD-10-CM

## 2022-08-19 NOTE — Unmapped (Signed)
Contacted pt to review results of recent EGD/dil/bx.  He tolerated the procedure well.  He is in agreement with the plan below and he will contact me in the interim with any issues.  (Of note, the pt goes by Brett Anderson)      Date of EGD Treatment pre-EGD Symptom response EGD findings (EREFS) Eosinophil counts Plan    Steroids Diet PPI   Proximal Distal    08/11/22 --    Dupi 300 weekly -- omep 20 daily yes Ex0.5, R0.5, E1, F1, S20  4 57 This is a very good result with the dupi, with demonstrable improvements in symptoms (basically no more dysphagia), histology, and endoscopic severity.  Given this, it is medically indicated that he will c/w this med at 300mg  weekly, and have repeat EGD in 6 mos.   09/23/21 Comp flutic 4mg  QHS -- omep 20 daily +/- Ex2, R1, E1, F1, S0 32 100 This is ongoing marked disease activity despite now using the even higher dose of the tCS.  Given this non-response to steroids, prior non-response to PPI, and history of strictures needing intermittent dilation, I believe dupilumab is indicated.  I have discussed this in detail with the pt and his father and they are thinking about if they would like to pursue this option.    ADDENDUM - the pt will try dupilumab - will start the approval process and then plan EGD ~ 6mos after starting the med to assess initial response.   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  We discussed options and he'd like to double up the dose to 67ml=4mg  QHS, and then we'll repeat EGD in 3 mos.  If he's is not responding at that time, we'll need to look towards dupi.  In addition, I've placed a referral to ENT at pt/family's request to see if his nocturnal awakening and gasping episodes could be related to laryngospasm.   11/15/2018 Comp flutic 2 mg QHS -- daily yes Ex0, R0, E1, F1, S0    (no stricture noted on pull-through) 3 37 This is a much improved response, and while not as good as when on the flutic vs OVB study (where he got flutic), it is similar to 2 procedures ago and I think a good place to c/w the treatment as prescribed currently.  He will c/w comp flutic 2mg  QHS and omep 20 daily, will monitor sx, and will return to GI clinic for a f/u appt in the fall when convenient for him.  Could consider repeat scope next winter.   08/02/18 Comp flutic, but not taking the correct dose -- daily no Ex1, R1, E1, F2, S20 40 150 Things are clearly flared up, but in talking with the patients father, it sounds like the pt was not doing the correct dose each day (maybe half or less), so the plan will be to aim for the correct dose (18ml=2mg  qhs) and repeat the EGD in ~ 3 mos.  This would explain the discrepancy between the prior effect of the flovent MDI in the study and this result.  If this doesn't work, would change back to OVB or else consider a trial of a biologic.   03/30/17 Comp OVB 2  mg QHS -- daily  Ex0, R0, E1, F1, S0 1 45 This an improvement, but not as good as after the study.  I think I'd like him to c/w the compounded OVB for now, especially given the sx and endoscopic improvement (w/ resolution of stricture).  They would like to try a local comp pharmacy, so new rx called into [facility] - [phone].  Given the formulation change, would like to repeat EGD/bx before the end of the year.   12/22/16 -- -- daily yes Ex2, R1, E1, F2, S16 75 65 While the pt continues to have few sx due to prior dilation, the EoE is clearly flaring right now.  Given the prior response to tCS, will start compounded OVB 2mg  QHS (for ease of administration) and plan repeat EGD +/- dilation in ~ 3 mos.   12/03/15 OVB vs flutic study -- BID Yes Ex0, R0, E1, F1, S17    TTS to 18 0 10 This is a great response to study drug.  At this point, he is finished with the treatment phase of the study, and after considering options, the pt and parents would like to enter the observation phase, and he will have follow-up as per protocol.  While I think he can decrease PPI to daily dosing, they'd like to maintain this unchanged for now, which is ok.   07/24/15 -- -- BID No Ex2, R2, E1, F2, S12     TTS to 15 65 >200 This confirms a dx of EoE.  We discussed the treatment approaches in detail, and they have elected to do the OVB vs fluticasone study.  We will set up the logistics of this and he will have f/u as per study protocol.

## 2022-08-24 MED FILL — DUPIXENT 300 MG/2 ML SUBCUTANEOUS PEN INJECTOR: SUBCUTANEOUS | 28 days supply | Qty: 8 | Fill #11

## 2022-08-25 DIAGNOSIS — K200 Eosinophilic esophagitis: Principal | ICD-10-CM

## 2022-08-25 DIAGNOSIS — R1310 Dysphagia, unspecified: Principal | ICD-10-CM

## 2022-08-25 MED ORDER — DUPIXENT 300 MG/2 ML SUBCUTANEOUS PEN INJECTOR
SUBCUTANEOUS | 11 refills | 28 days
Start: 2022-08-25 — End: 2023-08-25

## 2022-09-19 DIAGNOSIS — K200 Eosinophilic esophagitis: Principal | ICD-10-CM

## 2022-09-19 DIAGNOSIS — R1310 Dysphagia, unspecified: Principal | ICD-10-CM

## 2022-09-19 MED ORDER — DUPIXENT 300 MG/2 ML SUBCUTANEOUS PEN INJECTOR
SUBCUTANEOUS | 11 refills | 28 days
Start: 2022-09-19 — End: 2023-09-19

## 2022-09-20 NOTE — Unmapped (Signed)
[facility] Refill Coordination Note    Specialty Medication(s) to be Shipped:   Inflammatory Disorders: Dupixent    Other medication(s) to be shipped: No additional medications requested for fill at this time     Brett Anderson, DOB: 2000/09/18  Phone: [phone] (home) [phone] (work)      All above HIPAA information was verified with patient.     Was a Nurse, learning disability used for this call? No    Completed refill call assessment today to schedule patient's medication shipment from the [facility] [phone]).  All relevant notes have been reviewed.     Specialty medication(s) and dose(s) confirmed: Regimen is correct and unchanged.   Changes to medications: [name] reports no changes at this time.  Changes to insurance: No  New side effects reported not previously addressed with a pharmacist or physician: None reported  Questions for the pharmacist: No    Confirmed patient received a Conservation officer, historic buildings and a Surveyor, mining with first shipment. The patient will receive a drug information handout for each medication shipped and additional FDA Medication Guides as required.       DISEASE/MEDICATION-SPECIFIC INFORMATION        For patients on injectable medications: Patient currently has 0 doses left.  Next injection is scheduled for 09/27/22.    SPECIALTY MEDICATION ADHERENCE     Medication Adherence    Patient reported X missed doses in the last month: 0  Specialty Medication: DUPIXENT PEN 300 mg/2 mL Pnij (dupilumab)  Patient is on additional specialty medications: No  Informant: patient                                Were doses missed due to medication being on hold? No           REFERRAL TO PHARMACIST     Referral to the pharmacist: Not needed      [facility]     Shipping address confirmed in Epic.     Delivery Scheduled: Yes, Expected medication delivery date: 09/22/22.     Medication will be delivered via UPS to the prescription address in Epic WAM.    [name]   [facility] Specialty Technician

## 2022-09-21 DIAGNOSIS — K200 Eosinophilic esophagitis: Principal | ICD-10-CM

## 2022-09-21 DIAGNOSIS — R1310 Dysphagia, unspecified: Principal | ICD-10-CM

## 2022-09-21 MED ORDER — DUPIXENT 300 MG/2 ML SUBCUTANEOUS PEN INJECTOR
SUBCUTANEOUS | 11 refills | 28 days | Status: CP
Start: 2022-09-21 — End: 2023-09-21
  Filled 2022-09-22: qty 8, 28d supply, fill #0

## 2022-09-21 NOTE — Unmapped (Signed)
Keldric Slaght 's DUPIXENT PEN 300 mg/2 mL Pnij (dupilumab) shipment will be delayed as a result of no refills remain on the prescription.      I have reached out to the patient  at [phone] and communicated the delay. We will call the patient back to reschedule the delivery upon resolution. We have not confirmed the new delivery date.

## 2022-09-22 NOTE — Unmapped (Signed)
Lavor Sacha 's Dupixent shipment will be sent out  as a result of a new prescription for the medication has been received.      I have reached out to the patient  at [phone] and communicated the delivery change. We will reschedule the medication for the delivery date that the patient agreed upon.  We have confirmed the delivery date as 1/12, via ups.

## 2022-10-17 NOTE — Unmapped (Signed)
[facility] Refill Coordination Note    Specialty Medication(s) to be Shipped:   Inflammatory Disorders: Dupixent    Other medication(s) to be shipped: No additional medications requested for fill at this time     Brett Anderson, DOB: 06-13-2001  Phone: [phone] (home) [phone] (work)      All above HIPAA information was verified with patient.     Was a Nurse, learning disability used for this call? No    Completed refill call assessment today to schedule patient's medication shipment from the [facility] [phone]).  All relevant notes have been reviewed.     Specialty medication(s) and dose(s) confirmed: Regimen is correct and unchanged.   Changes to medications: Brett Anderson reports no changes at this time.  Changes to insurance: No  New side effects reported not previously addressed with a pharmacist or physician: None reported  Questions for the pharmacist: No    Confirmed patient received a Conservation officer, historic buildings and a Surveyor, mining with first shipment. The patient will receive a drug information handout for each medication shipped and additional FDA Medication Guides as required.       DISEASE/MEDICATION-SPECIFIC INFORMATION        For patients on injectable medications: Patient currently has 1 doses left.  Next injection is scheduled for 2/5.    SPECIALTY MEDICATION ADHERENCE     Medication Adherence    Patient reported X missed doses in the last month: 0  Specialty Medication: Dupixent                                Were doses missed due to medication being on hold? No    Dupixent 300  mg/59ml : 1 days of medicine on hand       REFERRAL TO PHARMACIST     Referral to the pharmacist: Not needed      [facility]     Shipping address confirmed in Epic.     Delivery Scheduled: Yes, Expected medication delivery date: 2/8.     Medication will be delivered via UPS to the prescription address in Epic WAM.    Brett Anderson, PharmD   [facility] Specialty Pharmacist

## 2022-10-18 DIAGNOSIS — R1310 Dysphagia, unspecified: Principal | ICD-10-CM

## 2022-10-18 DIAGNOSIS — K200 Eosinophilic esophagitis: Principal | ICD-10-CM

## 2022-10-18 NOTE — Unmapped (Signed)
Dupixent PA due for renewal.  Pa submitted via CoverMyMeds to [employer]. (Key: [id number])    [name] - IVL [place] [address] (PRIME [employer] [place] [employer])  Covered: Retail, Mail OrderUnknown: Specialty, Long-Term Care                Member ID: [id number] BIN: [id number]  DOB: 2000/12/31   Group ID: [id number] PCN:   Legal sex: M   Group name: [facility] COMMERCIAL [id number]  Address: [address]      [place] [address]

## 2022-10-19 NOTE — Unmapped (Signed)
Brett Anderson 's DUPIXENT PEN 300 mg/2 mL Pnij (dupilumab) shipment will be delayed as a result of prior authorization being required by the patient's insurance.     I have reached out to the patient  at [phone] and left a voicemail message.  We will call the patient back to reschedule the delivery upon resolution. We have not confirmed the new delivery date.

## 2022-10-21 NOTE — Unmapped (Signed)
Called [employer] [place] Provider line, selecting options for medication prior authorization.  Per representative, Dupixent is approved. Advised her I need to do a quantity exception. Advised this isn't needed. Asked representative to read criteria. She states Dupixent is approved for 4 pens in 28 days for a loading dose and 2 pens per day for maintenance.  I advised her she is not using the eosinophilic esophagitis criteria. She reads aloud further and identifies EoE is a listed reason for quantity limit exception. She advises me that she cannot do this over the phone and faxed a form. Advised her I've received form and this is not the Dupixent-specific quantity exception form. She states there is no option to do phone PA. Call ended. I completed form (media) and faxed to [facility].     Called pt's father to provide update on the barrier and our steps taken today as well as future steps with [employer].

## 2022-10-24 NOTE — Unmapped (Signed)
Called [employer] to follow-up on Dupixent quantity authorization.  She's not aware of a quantity limit exception request I faxed in Friday. Unable to do quantity limit PA over the phone per rep. She faxed me a different form for quantity PA and stated I can fax into [phone] to request the urgent review of the quantity pt needs for his EoE. This is a different form and different suggested fax number.  Completed form and faxed back, requesting urgent review.  Added form to media tab.

## 2022-10-25 DIAGNOSIS — K5100 Ulcerative (chronic) pancolitis without complications: Principal | ICD-10-CM

## 2022-10-26 NOTE — Unmapped (Signed)
Called again to [facility] stating that pt cannot fill rx for Dupixent.  Spoke to [place], stating that quantity is not approved per pharmacy and per pharmacy they've called the pharmacy help desk and were told the current approval is for 2 pens in 28 days. I advised Rennis Harding that I've completed 2 forms for quantity authorization (last Friday and Monday) and the only response has been to fax me back the original approval letter for Dupixent.  Advised pt cannot get medication due to a paperwork issue that I need assistance to resolve.    Placed on hold while [employer].  Issue escalated to pharmacist. Standard turn-around time is 24 hours. Advised that I will get a call or fax with resolution.    Call time 25 minutes.    Called pt's dad and provided quick update via voicemail.

## 2022-10-27 MED FILL — DUPIXENT 300 MG/2 ML SUBCUTANEOUS PEN INJECTOR: SUBCUTANEOUS | 28 days supply | Qty: 8 | Fill #1

## 2022-10-27 NOTE — Unmapped (Signed)
Brett Anderson 's DUPIXENT PEN 300 mg/2 mL Pnij (dupilumab) shipment will be sent out  as a result of prior authorization now approved.     I have reached out to the patient  at [phone] and communicated the delivery change. We will reschedule the medication for the delivery date that the patient agreed upon.  We have confirmed the delivery date as 10/28/22

## 2022-10-28 NOTE — Unmapped (Signed)
Clicked in error.  

## 2022-11-18 NOTE — Unmapped (Signed)
[facility] Refill Coordination Note    Specialty Medication(s) to be Shipped:   Inflammatory Disorders: Dupixent    Other medication(s) to be shipped: No additional medications requested for fill at this time     Brett Anderson, DOB: 02/12/2001  Phone: [phone] (home) [phone] (work)      All above HIPAA information was verified with patient.     Was a Nurse, learning disability used for this call? No    Completed refill call assessment today to schedule patient's medication shipment from the [facility] [phone]).  All relevant notes have been reviewed.     Specialty medication(s) and dose(s) confirmed: Regimen is correct and unchanged.   Changes to medications: Brett Anderson reports no changes at this time.  Changes to insurance: No  New side effects reported not previously addressed with a pharmacist or physician: None reported  Questions for the pharmacist: No    Confirmed patient received a Conservation officer, historic buildings and a Surveyor, mining with first shipment. The patient will receive a drug information handout for each medication shipped and additional FDA Medication Guides as required.       DISEASE/MEDICATION-SPECIFIC INFORMATION        For patients on injectable medications: Patient currently has 1 doses left.  Next injection is scheduled for 3/11.    SPECIALTY MEDICATION ADHERENCE     Medication Adherence    Patient reported X missed doses in the last month: 0  Specialty Medication: DUPIXENT PEN 300 mg/2 mL  Patient is on additional specialty medications: No              Were doses missed due to medication being on hold? No    Dupixent 300/2 mg/ml: 4 days of medicine on hand        REFERRAL TO PHARMACIST     Referral to the pharmacist: Not needed      [facility]     Shipping address confirmed in Epic.     Patient was notified of new phone menu : No    Delivery Scheduled: Yes, Expected medication delivery date: 11/23/22.     Medication will be delivered via UPS to the prescription address in Epic WAM.    Brett Anderson   [facility] Specialty Technician

## 2022-11-22 MED FILL — DUPIXENT 300 MG/2 ML SUBCUTANEOUS PEN INJECTOR: SUBCUTANEOUS | 28 days supply | Qty: 8 | Fill #2

## 2022-12-14 NOTE — Unmapped (Signed)
[facility] Refill Coordination Note    Specialty Medication(s) to be Shipped:   Inflammatory Disorders: Dupixent    Other medication(s) to be shipped: No additional medications requested for fill at this time     Paulino Sartin, DOB: 12/29/2000  Phone: [phone] (home) [phone] (work)      All above HIPAA information was verified with patient.     Was a Nurse, learning disability used for this call? No    Completed refill call assessment today to schedule patient's medication shipment from the [facility] [phone]).  All relevant notes have been reviewed.     Specialty medication(s) and dose(s) confirmed: Regimen is correct and unchanged.   Changes to medications: Valton reports no changes at this time.  Changes to insurance: No  New side effects reported not previously addressed with a pharmacist or physician: None reported  Questions for the pharmacist: No    Confirmed patient received a Conservation officer, historic buildings and a Surveyor, mining with first shipment. The patient will receive a drug information handout for each medication shipped and additional FDA Medication Guides as required.       DISEASE/MEDICATION-SPECIFIC INFORMATION        For patients on injectable medications: Patient currently has 1 doses left.  Next injection is scheduled for 12/19/22.    SPECIALTY MEDICATION ADHERENCE     Medication Adherence    Specialty Medication: DUPIXENT PEN 300 mg/2 mL  Patient is on additional specialty medications: No              Were doses missed due to medication being on hold? No    Dupixent 300/2 mg/ml: 4 days of medicine on hand        REFERRAL TO PHARMACIST     Referral to the pharmacist: Not needed      [facility]     Shipping address confirmed in Epic.     Patient was notified of new phone menu : No    Delivery Scheduled: Yes, Expected medication delivery date: 12/23/22.     Medication will be delivered via UPS to the prescription address in Epic WAM.    Gaspar Cola Shared [facility] Specialty Technician

## 2022-12-22 MED FILL — DUPIXENT 300 MG/2 ML SUBCUTANEOUS PEN INJECTOR: SUBCUTANEOUS | 28 days supply | Qty: 8 | Fill #3

## 2023-01-17 NOTE — Unmapped (Signed)
[facility] Refill Coordination Note    Specialty Medication(s) to be Shipped:   Inflammatory Disorders: Dupixent    Other medication(s) to be shipped: No additional medications requested for fill at this time     Brett Anderson, DOB: 21-Jun-2001  Phone: [phone] (home) [phone] (work)      All above HIPAA information was verified with patient.     Was a Nurse, learning disability used for this call? No    Completed refill call assessment today to schedule patient's medication shipment from the [facility] [phone]).  All relevant notes have been reviewed.     Specialty medication(s) and dose(s) confirmed: Regimen is correct and unchanged.   Changes to medications: Green reports no changes at this time.  Changes to insurance: No  New side effects reported not previously addressed with a pharmacist or physician: None reported  Questions for the pharmacist: No    Confirmed patient received a Conservation officer, historic buildings and a Surveyor, mining with first shipment. The patient will receive a drug information handout for each medication shipped and additional FDA Medication Guides as required.       DISEASE/MEDICATION-SPECIFIC INFORMATION        For patients on injectable medications: Patient currently has 1 doses left.  Next injection is scheduled for 5/13.    SPECIALTY MEDICATION ADHERENCE     Medication Adherence    Patient reported X missed doses in the last month: 0  Specialty Medication: DUPIXENT PEN 300 mg/2 mL  Patient is on additional specialty medications: No              Were doses missed due to medication being on hold? No    Dupixent 300/2 mg/ml: 6 days of medicine on hand        REFERRAL TO PHARMACIST     Referral to the pharmacist: Not needed      [facility]     Shipping address confirmed in Epic.       Delivery Scheduled: Yes, Expected medication delivery date: 01/24/23.     Medication will be delivered via UPS to the prescription address in Epic WAM.    [name] [facility] Specialty Technician

## 2023-01-23 MED FILL — DUPIXENT 300 MG/2 ML SUBCUTANEOUS PEN INJECTOR: SUBCUTANEOUS | 28 days supply | Qty: 8 | Fill #4

## 2023-01-24 NOTE — Unmapped (Signed)
EGD  Procedure #1     Procedure #2   [id number]  MRN   Dellon  Endoscopist     Is the patient's health insurance [employer], [facility]), or [employer]age?     Urgent procedure     Are you pregnant?     Are you in the process of scheduling or awaiting results of a heart ultrasound, stress test, or catheterization to evaluate new or worsening chest pain, dizziness, or shortness of breath?     Do you take: Plavix (clopidogrel), Coumadin (warfarin), Lovenox (enoxaparin), Pradaxa (dabigatran), Effient (prasugrel), Xarelto (rivaroxaban), Eliquis (apixaban), Pletal (cilostazol), or Brilinta (ticagrelor)?          Which of the above medications are you taking?          What is the name of the medical practice that manages this medication?          What is the name of the medical provider who manages this medication?     Do you have hemophilia, von Willebrand disease, or low platelets?     Do you have a pacemaker or implanted cardiac defibrillator?     Has a [facility] provider specified the location(s)?     Which location(s) did [facility] specify?        [facility]        [place]        [facility]        [facility]-Mod Sed[facility]     Is procedure indication for variceal banding (this does NOT include variceal screening)?     Have you had a heart attack, stroke or heart stent placement within the past 6 months?     Month of event     Year of event (ONLY ENTER LAST 2 DIGITS)        5  Height (feet)   10  Height (inches)   165  Weight (pounds)   23.7  BMI          Did the ordering provider specify a bowel prep?          What bowel prep was specified?     Do you have chronic kidney disease?     Do you have chronic constipation or have you had poor quality bowel preps for past colonoscopies?     Do you have Crohn's disease or ulcerative colitis?     Have you had weight loss surgery?          When you walk around your house or grocery store, do you have to stop and rest due to shortness of breath, chest pain, or light-headedness?     Do you ever use supplemental oxygen?     Have you been hospitalized for cirrhosis of the liver or heart failure in the last 12 months?     Have you been treated for mouth or throat cancer with radiation or surgery?     Have you been told that it is difficult for doctors to insert a breathing tube in you during anesthesia?     Have you had a heart or lung transplant?          Are you on dialysis?     Do you have cirrhosis of the liver?     Do you have myasthenia gravis?     Is the patient a prisoner?          Have you been diagnosed with sleep apnea or do you wear a  CPAP machine at night?     Are you younger than 30?   TRUE  Have you previously received propofol sedation administered by an anesthesiologist for a GI procedure?     Do you drink an average of more than 3 drinks of alcohol per day?     Do you regularly take suboxone or any prescription medications for chronic pain?     Do you regularly take Ativan, Klonopin, Xanax, Valium, lorazepam, clonazepam, alprazolam, or diazepam?     Have you previously had difficulty with sedation during a GI procedure?     Have you been diagnosed with PTSD?     Are you allergic to fentanyl or midazolam (Versed)?     Do you take medications for HIV?   ################# ## ###################################################################################################################   MRN:          [id number]   Anticoag Review:  No   Nurse Triage:  No   GI Clinic Consult:  No   Procedure(s):  EGD     0   [facility]     [facility]     [place]        Endoscopist:  Dellon   Urgent:            No   Prep:                                 ################# ## ###################################################################################################################

## 2023-02-22 NOTE — Unmapped (Signed)
[facility]ty Pharmacy Refill Coordination Note    Specialty Medication(s) to be Shipped:   Inflammatory Disorders: Dupixent    Other medication(s) to be shipped: No additional medications requested for fill at this time     Kermith Manthey, DOB: 2001-04-26  Phone: [phone] (home) [phone] (work)      All above HIPAA information was verified with patient.     Was a Nurse, learning disability used for this call? No    Completed refill call assessment today to schedule patient's medication shipment from the [facility] [phone]).  All relevant notes have been reviewed.     Specialty medication(s) and dose(s) confirmed: Regimen is correct and unchanged.   Changes to medications: Erbie reports no changes at this time.  Changes to insurance: No  New side effects reported not previously addressed with a pharmacist or physician: None reported  Questions for the pharmacist: No    Confirmed patient received a Conservation officer, historic buildings and a Surveyor, mining with first shipment. The patient will receive a drug information handout for each medication shipped and additional FDA Medication Guides as required.       DISEASE/MEDICATION-SPECIFIC INFORMATION        For patients on injectable medications: Patient currently has 0 doses left.  Next injection is scheduled for 6/14.    SPECIALTY MEDICATION ADHERENCE     Medication Adherence    Patient reported X missed doses in the last month: 0  Specialty Medication: DUPIXENT PEN 300 mg/2 mL  Patient is on additional specialty medications: No              Were doses missed due to medication being on hold? No    Dupixent 300/2 mg/ml: 2 days of medicine on hand        REFERRAL TO PHARMACIST     Referral to the pharmacist: Not needed      [facility]     Shipping address confirmed in Epic.       Delivery Scheduled: Yes, Expected medication delivery date: 02/24/23.     Medication will be delivered via UPS to the prescription address in Epic WAM.    Gaspar Cola [facility] Specialty Technician

## 2023-02-23 MED FILL — DUPIXENT 300 MG/2 ML SUBCUTANEOUS PEN INJECTOR: SUBCUTANEOUS | 28 days supply | Qty: 8 | Fill #5

## 2023-03-13 NOTE — Unmapped (Signed)
[facility] Pharm[facility] Clinical Assessment & Refill Coordination Note    Brett Anderson, [place]: 2001/06/12  Phone: [phone] (home) [phone] (work)    All above HIPAA information was verified with patient.     Was a Nurse, learning disability used for this call? No    Specialty Medication(s):   Inflammatory Disorders: Dupixent     Current Outpatient Medications   Medication Sig Dispense Refill    clonazePAM (KLONOPIN) 0.5 MG tablet Take 1 tablet (0.5 mg total) by mouth Three (3) times a day.      dupilumab (DUPIXENT PEN) 300 mg/2 mL PnIj Inject the contents of 1 pen (300 mg) under the skin every seven (7) days. 8 mL 11    empty container Misc Used as directed 1 each 5    omeprazole (PRILOSEC) 20 MG capsule Take 1 capsule (20 mg total) by mouth daily. 90 capsule 3    OXcarbazepine (TRILEPTAL) 300 MG tablet Take 1 tablet (300 mg total) by mouth two (2) times a day.      ZONISAMIDE ORAL Take 400 mg by mouth.       No current facility-administered medications for this visit.        Changes to medications: [name] reports no changes at this time.    No Known Allergies    Changes to allergies: No    SPECIALTY MEDICATION ADHERENCE     Dupixent 300  mg/17mL : 1 dose of medicine on hand        Specialty medication(s) dose(s) confirmed: Regimen is correct and unchanged.     Are there any concerns with adherence? No    Adherence counseling provided? Not needed    CLINICAL MANAGEMENT AND INTERVENTION      Clinical Benefit Assessment:    Do you feel the medicine is effective or helping your condition? Yes    Clinical Benefit counseling provided? Not needed    Adverse Effects Assessment:    Are you experiencing any side effects? No    Are you experiencing difficulty administering your medicine? No    Quality of Life Assessment:    Quality of Life    Rheumatology  Oncology  Dermatology  Cystic Fibrosis          How many days over the past month did your EoE  keep you from your normal activities? For example, brushing your teeth or getting up in the morning. 0    Have you discussed this with your provider? Not needed    Acute Infection Status:    Acute infections noted within Epic:  No active infections  Patient reported infection: None    Therapy Appropriateness:    Is therapy appropriate and patient progressing towards therapeutic goals? Yes, therapy is appropriate and should be continued    DISEASE/MEDICATION-SPECIFIC INFORMATION      For patients on injectable medications: Patient currently has 1 doses left.  Next injection is scheduled for 7/7.    Chronic Inflammatory Diseases: Have you experienced any flares in the last month? No  Has this been reported to your provider? Not applicable    PATIENT SPECIFIC NEEDS     Does the patient have any physical, cognitive, or cultural barriers? No    Is the patient high risk? No    Did the patient require a clinical intervention? No    Does the patient require physician intervention or other additional services (i.e., nutrition, smoking cessation, social work)? No    SOCIAL DETERMINANTS OF HEALTH  At the [facility], we have learned that life circumstances - like trouble affording food, housing, utilities, or transportation can affect the health of many of our patients.   That is why we wanted to ask: are you currently experiencing any life circumstances that are negatively impacting your health and/or quality of life? No    Social Determinants of Health     Financial Resource Strain: Low Risk  (08/03/2020)    Received from [facility] visits prior to 11/12/2022.    Overall Financial Resource Strain (CARDIA)     Difficulty of Paying Living Expenses: Not hard at all   Internet Connectivity: Not on file   Food Insecurity: No Food Insecurity (06/13/2022)    Received from [facility] visits prior to 11/12/2022.    Hunger Vital Sign     Worried About Running Out of Food in the Last Year: Never true     Ran Out of Food in the Last Year: Never true   Tobacco Use: Low Risk  (08/11/2022)    Patient History     Smoking Tobacco Use: Never     Smokeless Tobacco Use: Never     Passive Exposure: Not on file   Housing/Utilities: Not on file   Alcohol Use: Not At Risk (08/03/2020)    Received from [facility] visits prior to 11/12/2022.    AUDIT-C     Frequency of Alcohol Consumption: Never     Average Number of Drinks: Patient refused     Frequency of Binge Drinking: Never   Transportation Needs: No Transportation Needs (06/13/2022)    [facility] visits prior to 11/12/2022.    PRAPARE - Therapist, art (Medical): No     Lack of Transportation (Non-Medical): No   Substance Use: Not on file   Health Literacy: Not on file   Physical Activity: Sufficiently Active (08/03/2020)    [facility] visits prior to 11/12/2022.    Exercise Vital Sign     Days of Exercise per Week: 5 days     Minutes of Exercise per Session: 150+ min   Interpersonal Safety: Not on file   Stress: No Stress Concern Present (08/03/2020)    Received from [facility] visits prior to 11/12/2022.    [employer] of Occupational Health - Occupational Stress Questionnaire     Feeling of Stress : Not at all   Intimate Partner Violence: Not At Risk (08/03/2020)    [facility] from [facility] visits prior to 11/12/2022.    Humiliation, Afraid, Rape, and Kick questionnaire     Fear of Current or Ex-Partner: No     Emotionally Abused: No     Physically Abused: No     Sexually Abused: No   Depression: Not at risk (06/13/2022)    [facility] from [facility] visits prior to 11/12/2022.    PHQ-2     SDOH PHQ2 SCORE: 0   Social Connections: Moderately Isolated (08/03/2020)    Received from [facility] visits prior to 11/12/2022.    Social Connection and Isolation Panel [NHANES]     Frequency of Communication with Friends and Family: Twice a week     Frequency of Social Gatherings with Friends and Family: Once a week     Attends Religious Services: Never  Active Member of Clubs or Organizations: Yes     Attends Banker Meetings: More than 4 times per year     Marital Status: Never married       Would you be willing to receive help with any of the needs that you have identified today? Not applicable       SHIPPING     Specialty Medication(s) to be Shipped:   Inflammatory Disorders: Dupixent    Other medication(s) to be shipped: No additional medications requested for fill at this time     Changes to insurance: No    Delivery Scheduled: Yes, Expected medication delivery date: 7/10.     Medication will be delivered via UPS to the confirmed prescription address in [facility].    The patient will receive a drug information handout for each medication shipped and additional FDA Medication Guides as required.  Verified that patient has previously received a Conservation officer, historic buildings and a Surveyor, mining.    The patient or caregiver noted above participated in the development of this care plan and knows that they can request review of or adjustments to the care plan at any time.      All of the patient's questions and concerns have been addressed.    Teofilo Pod, PharmD   [facility] Specialty Pharmacist

## 2023-03-21 MED FILL — DUPIXENT 300 MG/2 ML SUBCUTANEOUS PEN INJECTOR: SUBCUTANEOUS | 28 days supply | Qty: 8 | Fill #6

## 2023-04-12 NOTE — Unmapped (Signed)
[facility] Special[facility] Pharmacy Refill Coordination Note    Specialty Medication(s) to be Shipped:   Inflammatory Disorders: Dupixent    Other medication(s) to be shipped: No additional medications requested for fill at this time     Brett Anderson, DOB: 2001-01-15  Phone: [phone] (home) [phone] (work)      All above HIPAA information was verified with patient.     Was a Nurse, learning disability used for this call? No    Completed refill call assessment today to schedule patient's medication shipment from the [facility] [phone]).  All relevant notes have been reviewed.     Specialty medication(s) and dose(s) confirmed: Regimen is correct and unchanged.   Changes to medications: Trevione reports no changes at this time.  Changes to insurance: No  New side effects reported not previously addressed with a pharmacist or physician: None reported  Questions for the pharmacist: No    Confirmed patient received a Conservation officer, historic buildings and a Surveyor, mining with first shipment. The patient will receive a drug information handout for each medication shipped and additional FDA Medication Guides as required.       DISEASE/MEDICATION-SPECIFIC INFORMATION        For patients on injectable medications: Patient currently has 1 doses left.  Next injection is scheduled for 8/2.    SPECIALTY MEDICATION ADHERENCE     Medication Adherence    Patient reported X missed doses in the last month: 0  Specialty Medication: DUPIXENT PEN 300 mg/2 mL  Patient is on additional specialty medications: No              Were doses missed due to medication being on hold? No    Dupixent 300/2 mg/ml: 2 days of medicine on hand        REFERRAL TO PHARMACIST     Referral to the pharmacist: Not needed      [facility]     Shipping address confirmed in Epic.       Delivery Scheduled: Yes, Expected medication delivery date: 04/18/23.     Medication will be delivered via UPS to the prescription address in Epic WAM.    [name] [facility] Specialty Technician

## 2023-04-17 MED FILL — DUPIXENT 300 MG/2 ML SUBCUTANEOUS PEN INJECTOR: SUBCUTANEOUS | 28 days supply | Qty: 8 | Fill #7

## 2023-05-10 NOTE — Unmapped (Signed)
[facility] Refill Coordination Note    Specialty Medication(s) to be Shipped:   Inflammatory Disorders: Dupixent    Other medication(s) to be shipped: No additional medications requested for fill at this time     Brett Anderson, DOB: 08/13/2001  Phone: [phone] (home) [phone] (work)      All above HIPAA information was verified with patient.     Was a Nurse, learning disability used for this call? No    Completed refill call assessment today to schedule patient's medication shipment from the [facility] [phone]).  All relevant notes have been reviewed.     Specialty medication(s) and dose(s) confirmed: Regimen is correct and unchanged.   Changes to medications: Brett Anderson reports no changes at this time.  Changes to insurance: No  New side effects reported not previously addressed with a pharmacist or physician: None reported  Questions for the pharmacist: No    Confirmed patient received a Conservation officer, historic buildings and a Surveyor, mining with first shipment. The patient will receive a drug information handout for each medication shipped and additional FDA Medication Guides as required.       DISEASE/MEDICATION-SPECIFIC INFORMATION        For patients on injectable medications: Patient currently has 1 doses left.  Next injection is scheduled for 8/28.    SPECIALTY MEDICATION ADHERENCE     Medication Adherence    Patient reported X missed doses in the last month: 0  Specialty Medication: DUPIXENT PEN 300 mg/2 mL  Patient is on additional specialty medications: No              Were doses missed due to medication being on hold? No    Dupixent 300/2 mg/ml: 1 doses of medicine on hand        REFERRAL TO PHARMACIST     Referral to the pharmacist: Not needed      [facility]     Shipping address confirmed in Epic.       Delivery Scheduled: Yes, Expected medication delivery date: 05/17/23.     Medication will be delivered via UPS to the prescription address in Epic WAM.    [name] [facility] Specialty Technician

## 2023-05-16 MED FILL — DUPIXENT 300 MG/2 ML SUBCUTANEOUS PEN INJECTOR: SUBCUTANEOUS | 28 days supply | Qty: 8 | Fill #8

## 2023-06-02 DIAGNOSIS — K200 Eosinophilic esophagitis: Principal | ICD-10-CM

## 2023-06-02 MED ORDER — OMEPRAZOLE 20 MG CAPSULE,DELAYED RELEASE
ORAL_CAPSULE | Freq: Every day | ORAL | 3 refills | 90 days | Status: CP
Start: 2023-06-02 — End: ?

## 2023-06-02 NOTE — Unmapped (Signed)
Received omeprazole refill fax request.  Pt with EGD within the year but due again.  Sent reminder via MyChart to schedule.  Refilled omeprazole per plan of care.

## 2023-06-12 NOTE — Unmapped (Signed)
Patient was notified of operational disruptions. Patient opted to: schedule their refill with understanding of potential delay until 10/1 or later. This was facilitated by pharmacy staff     [facility] Delivery Pharmacy Refill Coordination Note    Specialty Medication(s) to be Shipped:   Inflammatory Disorders: Dupixent    Other medication(s) to be shipped: No additional medications requested for fill at this time     Brett Anderson, DOB: [birth year]  Phone: [phone] (home) [phone] (work)      All above HIPAA information was verified with patient.     Was a Nurse, learning disability used for this call? No    Completed refill call assessment today to schedule patient's medication shipment from the [facility] Delivery Pharmacy  [phone]).  All relevant notes have been reviewed.     Specialty medication(s) and dose(s) confirmed: Regimen is correct and unchanged.   Changes to medications: Ruffus reports no changes at this time.  Changes to insurance: No  New side effects reported not previously addressed with a pharmacist or physician: None reported  Questions for the pharmacist: No    Confirmed patient received a Conservation officer, historic buildings and a Surveyor, mining with first shipment. The patient will receive a drug information handout for each medication shipped and additional FDA Medication Guides as required.       DISEASE/MEDICATION-SPECIFIC INFORMATION        For patients on injectable medications: Patient currently has 1 doses left.  Next injection is scheduled for this week.    SPECIALTY MEDICATION ADHERENCE     Medication Adherence    Patient reported X missed doses in the last month: 0  Specialty Medication: DUPIXENT PEN 300 mg/2 mL  Patient is on additional specialty medications: No              Were doses missed due to medication being on hold? No    Dupixent 300/2 mg/ml: 1 doses of medicine on hand        REFERRAL TO PHARMACIST     Referral to the pharmacist: Not needed      [facility]     Shipping address confirmed in Epic.       Delivery Scheduled: Yes, Expected medication delivery date: 06/16/23.     Medication will be delivered via UPS to the prescription address in Epic WAM.    [name]   [facility] Delivery Pharm[facility]  Specialty Technician

## 2023-06-13 NOTE — Unmapped (Signed)
Received fax from [facility] stating omeprazole needs PA.    Brett Anderson, Brett Anderson - IVL [address] ([employer] [place])  Covered: Retail, Mail OrderUnknown: Specialty, Long-Term Care                Member ID: [id number] BIN: [id number]  DOB: 03/10/2001   Group ID: [id number] PCN:   Legal sex: M   Group name: [id number]  Address: [address]      [place] [address]   Use As Primary Coverage      Requesting PA via MAP team

## 2023-06-15 MED FILL — DUPIXENT 300 MG/2 ML SUBCUTANEOUS PEN INJECTOR: SUBCUTANEOUS | 28 days supply | Qty: 8 | Fill #9

## 2023-07-11 NOTE — Unmapped (Signed)
[facility] Home Delivery Pharmacy Refill Coordination Note    Specialty Medication(s) to be Shipped:   Inflammatory Disorders: Dupixent    Other medication(s) to be shipped: No additional medications requested for fill at this time     Brett Anderson, DOB: 29-Jun-2001  Phone: [phone] (home) [phone] (work)      All above HIPAA information was verified with patient.     Was a Nurse, learning disability used for this call? No    Completed refill call assessment today to schedule patient's medication shipment from the [facility] Delivery Pharmacy  [phone]).  All relevant notes have been reviewed.     Specialty medication(s) and dose(s) confirmed: Regimen is correct and unchanged.   Changes to medications: Brett Anderson reports no changes at this time.  Changes to insurance: No  New side effects reported not previously addressed with a pharmacist or physician: None reported  Questions for the pharmacist: No    Confirmed patient received a Conservation officer, historic buildings and a Surveyor, mining with first shipment. The patient will receive a drug information handout for each medication shipped and additional FDA Medication Guides as required.       DISEASE/MEDICATION-SPECIFIC INFORMATION        For patients on injectable medications: Patient currently has 1 doses left.  Next injection is scheduled for 10/30.    SPECIALTY MEDICATION ADHERENCE     Medication Adherence    Patient reported X missed doses in the last month: 0  Specialty Medication: DUPIXENT PEN 300 mg/2 mL  Patient is on additional specialty medications: No              Were doses missed due to medication being on hold? No    Dupixent 300/2 mg/ml: 1 doses of medicine on hand        REFERRAL TO PHARMACIST     Referral to the pharmacist: Not needed      [facility]     Shipping address confirmed in Epic.       Delivery Scheduled: Yes, Expected medication delivery date: 07/18/23.     Medication will be delivered via UPS to the prescription address in Epic WAM.    [name]   [facility] Special[facility] Home Delivery Pharmacy  Specialty Technician

## 2023-07-17 MED FILL — DUPIXENT 300 MG/2 ML SUBCUTANEOUS PEN INJECTOR: SUBCUTANEOUS | 28 days supply | Qty: 8 | Fill #10

## 2023-08-16 NOTE — Unmapped (Signed)
[facility] Specialty and Home Delivery Pharmacy Refill Coordination Note    Specialty Medication(s) to be Shipped:   Inflammatory Disorders: Dupixent    Other medication(s) to be shipped: No additional medications requested for fill at this time     Brett Anderson, DOB: 04-11-2001  Phone: [phone] (home) [phone] (work)      All above HIPAA information was verified with patient.     Was a Nurse, learning disability used for this call? No    Completed refill call assessment today to schedule patient's medication shipment from the [facility] and Home Delivery Pharmacy  [phone]).  All relevant notes have been reviewed.     Specialty medication(s) and dose(s) confirmed: Regimen is correct and unchanged.   Changes to medications: Brett Anderson reports no changes at this time.  Changes to insurance: No  New side effects reported not previously addressed with a pharmacist or physician: None reported  Questions for the pharmacist: No    Confirmed patient received a Conservation officer, historic buildings and a Surveyor, mining with first shipment. The patient will receive a drug information handout for each medication shipped and additional FDA Medication Guides as required.       DISEASE/MEDICATION-SPECIFIC INFORMATION        For patients on injectable medications: Patient currently has 0 doses left.  Next injection is scheduled for 12/6.    SPECIALTY MEDICATION ADHERENCE     Medication Adherence    Specialty Medication: DUPIXENT PEN 300 mg/2 mL  Patient is on additional specialty medications: No              Were doses missed due to medication being on hold? No    DUPIXENT PEN 300 mg/2 mL   : 0 doses of medicine on hand       REFERRAL TO PHARMACIST     Referral to the pharmacist: Not needed      SHIPPING     Shipping address confirmed in Epic.       Delivery Scheduled: Yes, Expected medication delivery date: 12/6.     Medication will be delivered via UPS to the prescription address in Epic WAM.    Westley Gambles   [facility] Specialty and Home Delivery Pharmacy  Specialty Technician

## 2023-08-17 MED FILL — DUPIXENT 300 MG/2 ML SUBCUTANEOUS PEN INJECTOR: SUBCUTANEOUS | 28 days supply | Qty: 8 | Fill #11

## 2023-09-07 DIAGNOSIS — K200 Eosinophilic esophagitis: Principal | ICD-10-CM

## 2023-09-07 DIAGNOSIS — R1310 Dysphagia, unspecified: Principal | ICD-10-CM

## 2023-09-07 MED ORDER — DUPIXENT 300 MG/2 ML SUBCUTANEOUS PEN INJECTOR
SUBCUTANEOUS | 11 refills | 28.00 days
Start: 2023-09-07 — End: 2024-09-06

## 2023-09-08 MED ORDER — DUPIXENT 300 MG/2 ML SUBCUTANEOUS PEN INJECTOR
SUBCUTANEOUS | 5 refills | 28.00 days | Status: CP
Start: 2023-09-08 — End: 2024-09-07
  Filled 2023-09-21: qty 8, 28d supply, fill #0

## 2023-09-08 NOTE — Unmapped (Signed)
Pt due for EGD. Refill so he can remain on Dupixent per Dr. Jessee Avers while I try to contact pt.

## 2023-09-15 NOTE — Unmapped (Signed)
[facility] Specialty and Home Delivery Pharmacy Refill Coordination Note    Brett Anderson, : Jun 05, 2001  Phone: [phone] (home) [phone] (work)      All above HIPAA information was verified with patient.         09/14/2023     8:28 AM   Specialty Rx Medication Refill Questionnaire   Which Medications would you like refilled and shipped? Dupixent 1 dose left   Please list all current allergies: None   Have you missed any doses in the last 30 days? No   Have you had any changes to your medication(s) since your last refill? No   How many days remaining of each medication do you have at home? 7   If receiving an injectable medication, next injection date is 09/14/2023   Have you experienced any side effects in the last 30 days? No   Please enter the full address (street address, city, state, zip code) where you would like your medication(s) to be delivered to. [address] [place], [place], [place], [address]   Please specify on which day you would like your medication(s) to arrive. Note: if you need your medication(s) within 3 days, please call the pharmacy to schedule your order at [phone]  09/18/2023   Has your insurance changed since your last refill? No   Would you like a pharmacist to call you to discuss your medication(s)? No   Do you require a signature for your package? (Note: if we are billing Medicare Part B or your order contains a controlled substance, we will require a signature) No         Completed refill call assessment today to schedule patient's medication shipment from the [facility] Specialty and Home Delivery Pharmacy [phone]).  All relevant notes have been reviewed.       Confirmed patient received a Conservation officer, historic buildings and a Surveyor, mining with first shipment. The patient will receive a drug information handout for each medication shipped and additional FDA Medication Guides as required.         REFERRAL TO PHARMACIST     Referral to the pharmacist: Not needed      [facility]     Shipping address confirmed in Epic.     Delivery Scheduled: Yes, Expected medication delivery date: 09/22/23.     Medication will be delivered via UPS to the prescription address in Epic WAM.    Tobi Bastos, PharmD   [facility] Specialty and Home Delivery Pharmacy Specialty Pharmacist

## 2023-10-16 DIAGNOSIS — K222 Esophageal obstruction: Principal | ICD-10-CM

## 2023-10-16 DIAGNOSIS — R1310 Dysphagia, unspecified: Principal | ICD-10-CM

## 2023-10-16 DIAGNOSIS — K200 Eosinophilic esophagitis: Principal | ICD-10-CM

## 2023-10-16 NOTE — Unmapped (Signed)
Dupixent PA due for renewal. Sent to [employer] via CoverMyMeds (Key: [id number]).    [name] - IVL  [address] (PRIME BC[employer] [place])  Covered: Retail, Mail OrderUnknown: Specialty, Long-Term Care                Member ID: [id number] BIN: [id number]  DOB: Dec 24, 2000   Group ID: [id number] PCN:   Legal sex: M   Group name: [facility] COMMERCIAL [id number]  Address: [address]      [place] [address]   Use As Primary Coverage

## 2023-10-16 NOTE — Unmapped (Signed)
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 He continues on Dupixent 300mg  injected subcutaneously every 7 days.  Pt states he's still doing well with improved swallowing since starting Dupixent.  Last upper endoscopy in November 2023 demonstrated great endoscopic and histologic control of his EoE. He's planning follow-up endoscopy this year when he can get away from work per pt report. New order for EGD placed to replace expired order.

## 2023-10-22 DIAGNOSIS — K200 Eosinophilic esophagitis: Principal | ICD-10-CM

## 2023-10-22 MED ORDER — OMEPRAZOLE 20 MG CAPSULE,DELAYED RELEASE
ORAL_CAPSULE | Freq: Every day | ORAL | 3 refills | 90.00 days
Start: 2023-10-22 — End: ?

## 2023-10-23 NOTE — Unmapped (Signed)
[facility] Specialty and Home Delivery Pharmacy Refill Coordination Note    Brett Anderson, [place]: 09-Mar-2001  Phone: [phone] (home) [phone] (work)      All above HIPAA information was verified with patient.         10/20/2023     5:52 PM   Specialty Rx Medication Refill Questionnaire   Which Medications would you like refilled and shipped? Dupixent and none left   Please list all current allergies: None   Have you missed any doses in the last 30 days? No   Have you had any changes to your medication(s) since your last refill? No   How many days remaining of each medication do you have at home? None   If receiving an injectable medication, next injection date is 10/27/2023   Have you experienced any side effects in the last 30 days? No   Please enter the full address (street address, city, state, zip code) where you would like your medication(s) to be delivered to. [address] [place], [place], [place], [address]   Please specify on which day you would like your medication(s) to arrive. Note: if you need your medication(s) within 3 days, please call the pharmacy to schedule your order at [phone]  10/25/2023   Has your insurance changed since your last refill? No   Would you like a pharmacist to call you to discuss your medication(s)? No   Do you require a signature for your package? (Note: if we are billing Medicare Part B or your order contains a controlled substance, we will require a signature) No         Completed refill call assessment today to schedule patient's medication shipment from the [facility] Specialty and Home Delivery Pharmacy [phone]).  All relevant notes have been reviewed.       Confirmed patient received a Conservation officer, historic buildings and a Surveyor, mining with first shipment. The patient will receive a drug information handout for each medication shipped and additional FDA Medication Guides as required.         REFERRAL TO PHARMACIST     Referral to the pharmacist: Not needed      [facility]     Shipping address confirmed in Epic.     Delivery Scheduled: Yes, Expected medication delivery date: 10/25/23.     Medication will be delivered via UPS to the prescription address in Epic WAM.    Brett Anderson   [facility] Specialty and Home Delivery Pharmacy Specialty Technician

## 2023-10-24 MED FILL — DUPIXENT 300 MG/2 ML SUBCUTANEOUS PEN INJECTOR: SUBCUTANEOUS | 28 days supply | Qty: 8 | Fill #1

## 2023-10-25 MED ORDER — OMEPRAZOLE 20 MG CAPSULE,DELAYED RELEASE
ORAL_CAPSULE | Freq: Every day | ORAL | 3 refills | 90.00 days | Status: CP
Start: 2023-10-25 — End: ?

## 2023-11-21 NOTE — Unmapped (Signed)
 [facility] Specialty and Home Delivery Pharmacy Refill Coordination Note    Specialty Medication(s) to be Shipped:   Inflammatory Disorders: Dupixent    Other medication(s) to be shipped: No additional medications requested for fill at this time     Fred Franzen, DOB: [birth year]  Phone: [phone] (home) [phone] (work)      All above HIPAA information was verified with patient.     Was a Nurse, learning disability used for this call? No    Completed refill call assessment today to schedule patient's medication shipment from the [facility] and Home Delivery Pharmacy  [phone]).  All relevant notes have been reviewed.     Specialty medication(s) and dose(s) confirmed: Regimen is correct and unchanged.   Changes to medications: Dustan reports no changes at this time.  Changes to insurance: No  New side effects reported not previously addressed with a pharmacist or physician: None reported  Questions for the pharmacist: No    Confirmed patient received a Conservation officer, historic buildings and a Surveyor, mining with first shipment. The patient will receive a drug information handout for each medication shipped and additional FDA Medication Guides as required.       DISEASE/MEDICATION-SPECIFIC INFORMATION        For patients on injectable medications: Patient currently has 0 doses left.  Next injection is scheduled for 3/14.    SPECIALTY MEDICATION ADHERENCE     Medication Adherence    Patient reported X missed doses in the last month: 0  Specialty Medication: DUPIXENT PEN 300 mg/2 mL  Patient is on additional specialty medications: No              Were doses missed due to medication being on hold? No    Dupixent 300/2 mg/ml: 0 doses of medicine on hand        REFERRAL TO PHARMACIST     Referral to the pharmacist: Not needed      [facility]     Shipping address confirmed in Epic.     Cost and Payment: Patient has a $0 copay, payment information is not required.    Delivery Scheduled: Yes, Expected medication delivery date: 11/23/23.     Medication will be delivered via UPS to the prescription address in Epic WAM.    [name]   [facility] Specialty and Home Delivery Pharmacy  Specialty Technician

## 2023-11-22 MED FILL — DUPIXENT 300 MG/2 ML SUBCUTANEOUS PEN INJECTOR: SUBCUTANEOUS | 28 days supply | Qty: 8 | Fill #2

## 2023-12-21 NOTE — Unmapped (Signed)
 [facility] Specialty and Home Delivery Pharmacy Refill Coordination Note    Specialty Medication(s) to be Shipped:   Inflammatory Disorders: Dupixent    Other medication(s) to be shipped: No additional medications requested for fill at this time     Brett Anderson, DOB: 04/30/2001  Phone: [phone] (home) [phone] (work)      All above HIPAA information was verified with patient.     Was a Nurse, learning disability used for this call? No    Completed refill call assessment today to schedule patient's medication shipment from the [facility] and Home Delivery Pharmacy  [phone]).  All relevant notes have been reviewed.     Specialty medication(s) and dose(s) confirmed: Regimen is correct and unchanged.   Changes to medications: Cleston reports no changes at this time.  Changes to insurance: No  New side effects reported not previously addressed with a pharmacist or physician: None reported  Questions for the pharmacist: No    Confirmed patient received a Conservation officer, historic buildings and a Surveyor, mining with first shipment. The patient will receive a drug information handout for each medication shipped and additional FDA Medication Guides as required.       DISEASE/MEDICATION-SPECIFIC INFORMATION        For patients on injectable medications: Patient currently has 1 doses left.  Next injection is scheduled for 4/11.    SPECIALTY MEDICATION ADHERENCE     Medication Adherence    Patient reported X missed doses in the last month: 0  Specialty Medication: DUPIXENT PEN 300 mg/2 mL  Patient is on additional specialty medications: No              Were doses missed due to medication being on hold? No    Dupixent 300/2 mg/ml: 1 doses of medicine on hand        REFERRAL TO PHARMACIST     Referral to the pharmacist: Not needed      [facility]     Shipping address confirmed in Epic.     Cost and Payment: Patient has a $0 copay, payment information is not required.    Delivery Scheduled: Yes, Expected medication delivery date: 12/26/23.     Medication will be delivered via UPS to the prescription address in Epic WAM.    Canary Ceo   [facility] Specialty and Home Delivery Pharmacy  Specialty Technician

## 2023-12-25 MED FILL — DUPIXENT 300 MG/2 ML SUBCUTANEOUS PEN INJECTOR: SUBCUTANEOUS | 28 days supply | Qty: 8 | Fill #3

## 2024-01-16 NOTE — Unmapped (Signed)
 [facility] Specialty and Home Delivery Pharmacy Refill Coordination Note    Specialty Medication(s) to be Shipped:   Inflammatory Disorders: Dupixent    Other medication(s) to be shipped: No additional medications requested for fill at this time     Brett Anderson, DOB: [birth year]  Phone: [phone] (home) [phone] (work)      All above HIPAA information was verified with patient.     Was a Nurse, learning disability used for this call? No    Completed refill call assessment today to schedule patient's medication shipment from the [facility] and Home Delivery Pharmacy  [phone]).  All relevant notes have been reviewed.     Specialty medication(s) and dose(s) confirmed: Regimen is correct and unchanged.   Changes to medications: Kwamaine reports no changes at this time.  Changes to insurance: No  New side effects reported not previously addressed with a pharmacist or physician: None reported  Questions for the pharmacist: No    Confirmed patient received a Conservation officer, historic buildings and a Surveyor, mining with first shipment. The patient will receive a drug information handout for each medication shipped and additional FDA Medication Guides as required.       DISEASE/MEDICATION-SPECIFIC INFORMATION        For patients on injectable medications: Patient currently has 1 doses left.  Next injection is scheduled for 5/6.    SPECIALTY MEDICATION ADHERENCE     Medication Adherence    Patient reported X missed doses in the last month: 0  Specialty Medication: DUPIXENT PEN 300 mg/2 mL  Patient is on additional specialty medications: No              Were doses missed due to medication being on hold? No    Dupixent 300/2 mg/ml: 1 doses of medicine on hand        REFERRAL TO PHARMACIST     Referral to the pharmacist: Not needed      [facility]     Shipping address confirmed in Epic.     Cost and Payment: Patient has a $0 copay, payment information is not required.    Delivery Scheduled: Yes, Expected medication delivery date: 01/19/24.     Medication will be delivered via UPS to the prescription address in Epic WAM.    Canary Ceo   [facility] Specialty and Home Delivery Pharmacy  Specialty Technician

## 2024-01-18 MED FILL — DUPIXENT 300 MG/2 ML SUBCUTANEOUS PEN INJECTOR: SUBCUTANEOUS | 28 days supply | Qty: 8 | Fill #4

## 2024-02-08 NOTE — Unmapped (Signed)
 EGD  Procedure #1     Procedure #2   [id number]  MRN   DELLON  Endoscopist     Is the patient's health insurance [employer], [employer], [facility]), UHC [employer] [place], [employer], or Cigna?     Urgent procedure     Are you pregnant?     Are you in the process of scheduling or awaiting results of a heart ultrasound, stress test, or catheterization to evaluate new or worsening chest pain, dizziness, or shortness of breath?     Do you take: Plavix (clopidogrel), Coumadin (warfarin), Lovenox (enoxaparin), Pradaxa (dabigatran), Effient (prasugrel), Xarelto (rivaroxaban), Eliquis (apixaban), Pletal (cilostazol), or Brilinta (ticagrelor)?          Did ordering provider indicate how long to hold this medication in the order comments?          Which of the above medications are you taking?          What is the name of the medical practice that manages this medication?          What is the name of the medical provider who manages this medication?     Do you have hemophilia, von Willebrand disease, or low platelets?     Do you have a pacemaker or implanted cardiac defibrillator?     Has a [facility] provider specified the location(s)?     Which location(s) did the [facility] provider specify?          [facility]          [place]          [facility]     Do you see a liver specialist for chronic liver disease?     Is the procedure indication for variceal screening?     Is procedure indication for variceal banding (this does NOT include variceal screening)?     Have you had a heart attack, stroke or heart stent placement within the past 6 months?     Month of event     Year of event (ONLY ENTER LAST 2 DIGITS)        5  Height (feet)   10  Height (inches)   168  Weight (pounds)   24.1  BMI          Did the ordering provider specify a bowel prep?          What bowel prep was specified?     Do you have an ostomy (bag on your stomach that collects your stool)?          Is it an ileostomy?          Is it a colostomy? Patient doesn't know.     Do you have chronic kidney disease?     Do you have chronic constipation or have you had poor quality bowel preps for past colonoscopies?     Do you have Crohn's disease or ulcerative colitis?     Have you had weight loss surgery?          When you walk around your house or grocery store, do you have to stop and rest due to shortness of breath, chest pain, or light-headedness?     Do you ever use supplemental oxygen?     Have you been hospitalized for cirrhosis of the liver or heart failure in the last 12 months?     Have you been treated for mouth or throat cancer with radiation or surgery?  Have you been told that it is difficult for doctors to insert a breathing tube in you during anesthesia?     Have you had a heart or lung transplant?          Are you on dialysis?     Do you have cirrhosis of the liver?     Do you have myasthenia gravis?     Is the patient a prisoner?   ################# ## ###################################################################################################################   MRN:  [id number]   Anticoag Review  No   Nurse Triage  No   GI clinic consult  No   Procedure(s):  EGD     0   Endoscopist:  DELLON   Urgent:  No   Prep:                    --------------------------- --- ----------------------------------------------------------------------------------------------------------------------------------------------------------------------------   G3 Locations:  [facility]     [facility]     [place]        Requested Locations:              ################# ## ###################################################################################################################

## 2024-02-09 NOTE — Unmapped (Signed)
 [facility] Delivery Pharm[facility] Clinical Assessment & Refill Coordination Note    Brett Anderson, DOB: 2000/10/11  Phone: [phone] (home) [phone] (work)    All above HIPAA information was verified with patient.     Was a Nurse, learning disability used for this call? No    Specialty Medication(s):   Inflammatory Disorders: Dupixent     Current Outpatient Medications   Medication Sig Dispense Refill    clonazePAM (KLONOPIN) 0.5 MG tablet Take 1 tablet (0.5 mg total) by mouth Three (3) times a day.      dupilumab (DUPIXENT PEN) 300 mg/2 mL pen injector Inject the contents of 1 pen (300 mg) under the skin every seven (7) days. 8 mL 5    empty container Misc Used as directed 1 each 5    omeprazole (PRILOSEC) 20 MG capsule Take 1 capsule (20 mg total) by mouth daily. 90 capsule 3    OXcarbazepine (TRILEPTAL) 300 MG tablet Take 1 tablet (300 mg total) by mouth two (2) times a day.      ZONISAMIDE ORAL Take 400 mg by mouth.       No current facility-administered medications for this visit.        Changes to medications: Brett Anderson reports no changes at this time.    Medication list has been reviewed and updated in Epic: Yes    No Known Allergies    Changes to allergies: No    Allergies have been reviewed and updated in Epic: Yes    SPECIALTY MEDICATION ADHERENCE     Dupixent 300 mg/2mL: 1 doses of medicine on hand   Medication Adherence    Patient reported X missed doses in the last month: 0  Specialty Medication: Dupixent 300mg /72mL -1q7d  Patient is on additional specialty medications: No  Informant: patient          Specialty medication(s) dose(s) confirmed: Regimen is correct and unchanged.     Are there any concerns with adherence? No    Adherence counseling provided? Not needed    CLINICAL MANAGEMENT AND INTERVENTION      Clinical Benefit Assessment:    Do you feel the medicine is effective or helping your condition? Yes    Clinical Benefit counseling provided? Not needed    Adverse Effects Assessment:    Are you experiencing any side effects? No    Are you experiencing difficulty administering your medicine? No    Quality of Life Assessment:    Quality of Life    Rheumatology  Oncology  Dermatology  Cystic Fibrosis          How many days over the past month did your EoE  keep you from your normal activities? For example, brushing your teeth or getting up in the morning. 0    Have you discussed this with your provider? Not needed    Acute Infection Status:    Acute infections noted within Epic:  No active infections    Patient reported infection: None    Therapy Appropriateness:    Is therapy appropriate based on current medication list, adverse reactions, adherence, clinical benefit and progress toward achieving therapeutic goals? Yes, therapy is appropriate and should be continued     Clinical Intervention:    Was an intervention completed as part of this clinical assessment? No    DISEASE/MEDICATION-SPECIFIC INFORMATION      For patients on injectable medications: Patient currently has 1 doses left.  Next injection is scheduled for 6/2.    Chronic Inflammatory  Diseases: Have you experienced any flares in the last month? No  Has this been reported to your provider? Not applicable    PATIENT SPECIFIC NEEDS     Does the patient have any physical, cognitive, or cultural barriers? No    Is the patient high risk? No    Does the patient require physician intervention or other additional services (i.e., nutrition, smoking cessation, social work)? No    Does the patient have an additional or emergency contact listed in their chart? Yes    SOCIAL DETERMINANTS OF HEALTH     At the [facility], we have learned that life circumstances - like trouble affording food, housing, utilities, or transportation can affect the health of many of our patients.   That is why we wanted to ask: are you currently experiencing any life circumstances that are negatively impacting your health and/or quality of life? No    Social Drivers of Health     Food Insecurity: No Food Insecurity (06/13/2022)    Received from [facility] visits prior to 11/12/2022.    Hunger Vital Sign     Worried About Running Out of Food in the Last Year: Never true     Ran Out of Food in the Last Year: Never true   Tobacco Use: Low Risk  (08/11/2022)    Patient History     Smoking Tobacco Use: Never     Smokeless Tobacco Use: Never     Passive Exposure: Not on file   Transportation Needs: No Transportation Needs (06/13/2022)    Received from [facility] visits prior to 11/12/2022.    PRAPARE - Therapist, art (Medical): No     Lack of Transportation (Non-Medical): No   Alcohol Use: Not At Risk (08/03/2020)    Received from [facility] visits prior to 11/12/2022.    AUDIT-C     Frequency of Alcohol Consumption: Never     Average Number of Drinks: Patient refused     Frequency of Binge Drinking: Never   Housing: Low Risk  (06/13/2022)    Received from [facility] visits prior to 11/12/2022.    Housing Stability Vital Sign     Unable to Pay for Housing in the Last Year: No     Number of Places Lived in the Last Year: 1     In the last 12 months, was there a time when you did not have a steady place to sleep or slept in a shelter (including now)?: No   Physical Activity: Sufficiently Active (08/03/2020)    Received from [facility] visits prior to 11/12/2022.    Exercise Vital Sign     Days of Exercise per Week: 5 days     Minutes of Exercise per Session: 150+ min   Utilities: Not At Risk (06/13/2022)    Received from [facility] visits prior to 11/12/2022.    [facility] Utilities     Threatened with loss of utilities: No   Stress: No Stress Concern Present (08/03/2020)    Received from [facility] visits prior to 11/12/2022.    [employer] of Occupational Health - Occupational Stress Questionnaire     Feeling of Stress : Not at all   Interpersonal Safety: Not on file   Substance Use: Not on file (07/23/2023)   Intimate Partner Violence: Not At Risk (08/03/2020)  Received from [facility] visits prior to 11/12/2022.    Humiliation, Afraid, Rape, and Kick questionnaire     Fear of Current or Ex-Partner: No     Emotionally Abused: No     Physically Abused: No     Sexually Abused: No   Social Connections: Moderately Isolated (08/03/2020)    Received from [facility] visits prior to 11/12/2022.    Social Advertising account executive [NHANES]     Frequency of Communication with Friends and Family: Twice a week     Frequency of Social Gatherings with Friends and Family: Once a week     Attends Religious Services: Never     Database administrator or Organizations: Yes     Attends Engineer, structural: More than 4 times per year     Marital Status: Never married   Physicist, medical Strain: Low Risk  (08/03/2020)    Received from [facility] visits prior to 11/12/2022.    Overall Financial Resource Strain (CARDIA)     Difficulty of Paying Living Expenses: Not hard at all   Health Literacy: Not on file   Internet Connectivity: Not on file       Would you be willing to receive help with any of the needs that you have identified today? Not applicable       SHIPPING     Specialty Medication(s) to be Shipped:   Inflammatory Disorders: Dupixent    Other medication(s) to be shipped: No additional medications requested for fill at this time     Changes to insurance: No    Cost and Payment: Patient has a $0 copay, payment information is not required.    Delivery Scheduled: Yes, Expected medication delivery date: 6/4.     Medication will be delivered via UPS to the confirmed prescription address [facility].    The patient will receive a drug information handout for each medication shipped and additional FDA Medication Guides as required.  Verified that patient has previously received a Conservation officer, historic buildings and a Surveyor, mining.    The patient or caregiver noted above participated in the development of this care plan and knows that they can request review of or adjustments to the care plan at any time.      All of the patient's questions and concerns have been addressed.    Arlyce Berger, PharmD   [facility] Delivery Pharm[facility] Specialty Pharmacist

## 2024-02-13 MED FILL — DUPIXENT 300 MG/2 ML SUBCUTANEOUS PEN INJECTOR: SUBCUTANEOUS | 28 days supply | Qty: 8 | Fill #5

## 2024-03-06 DIAGNOSIS — R1310 Dysphagia, unspecified: Principal | ICD-10-CM

## 2024-03-06 DIAGNOSIS — K200 Eosinophilic esophagitis: Principal | ICD-10-CM

## 2024-03-06 MED ORDER — DUPIXENT 300 MG/2 ML SUBCUTANEOUS PEN INJECTOR
SUBCUTANEOUS | 5 refills | 28.00000 days
Start: 2024-03-06 — End: 2025-03-06

## 2024-03-06 NOTE — Unmapped (Signed)
 [facility] and Home Delivery Pharm[facility] Refill Coordination Note    Specialty Medication(s) to be Shipped:   Inflammatory Disorders: Dupixent     Other medication(s) to be shipped: No additional medications requested for fill at this time     Brett Anderson, DOB: 07-Nov-2000  Phone: [phone] (home) [phone] (work)      All above HIPAA information was verified with patient.     Was a Nurse, learning disability used for this call? No    Completed refill call assessment today to schedule patient's medication shipment from the [facility] Delivery [facility]  [phone]).  All relevant notes have been reviewed.     Specialty medication(s) and dose(s) confirmed: Regimen is correct and unchanged.   Changes to medications: Brett Anderson reports no changes at this time.  Changes to insurance: No  New side effects reported not previously addressed with a pharmacist or physician: None reported  Questions for the pharmacist: No    Confirmed patient received a Conservation officer, historic buildings and a Surveyor, mining with first shipment. The patient will receive a drug information handout for each medication shipped and additional FDA Medication Guides as required.       DISEASE/MEDICATION-SPECIFIC INFORMATION        For patients on injectable medications: Patient currently has 1 doses left.  Next injection is scheduled for 6/27.    SPECIALTY MEDICATION ADHERENCE     Medication Adherence    Patient reported X missed doses in the last month: 0  Specialty Medication: DUPIXENT  PEN 300 mg/2 mL  Patient is on additional specialty medications: No              Were doses missed due to medication being on hold? No    Dupixent  300/2 mg/ml: 1 doses of medicine on hand        REFERRAL TO PHARMACIST     Referral to the pharmacist: Not needed      [facility]     Shipping address confirmed in Epic.     Cost and Payment: Patient has a $0 copay, payment information is not required.    Delivery Scheduled: Yes, Expected medication delivery date: 03/12/24.     Medication will be delivered via UPS to the prescription address in Epic WAM.    Brett Anderson   [facility] Delivery Pharm[facility]  Specialty Technician

## 2024-03-11 NOTE — Unmapped (Signed)
 Shahir Karen 's DUPIXENT  PEN 300 mg/2 mL pen injector (dupilumab ) shipment will be delayed as a result of no refills remain on the prescription.      I have reached out to the patient  at [phone] and communicated the delay. We will call the patient back to reschedule the delivery upon resolution. We have not confirmed the new delivery date.

## 2024-03-12 MED ORDER — DUPIXENT 300 MG/2 ML SUBCUTANEOUS PEN INJECTOR
SUBCUTANEOUS | 5 refills | 28.00000 days | Status: CP
Start: 2024-03-12 — End: 2025-03-12
  Filled 2024-03-13: qty 8, 28d supply, fill #0

## 2024-03-13 NOTE — Unmapped (Signed)
 Mohammedali Bedoy 's Dupixent  shipment will be rescheduled as a result of a new prescription for the medication has been received.      I have reached out to the patient  at [phone] and communicated the delivery change. We will reschedule the medication for the delivery date that the patient agreed upon.  We have confirmed the delivery date as 7/3, via ups.

## 2024-04-11 ENCOUNTER — Encounter
Admit: 2024-04-11 | Discharge: 2024-04-11 | Payer: BLUE CROSS/BLUE SHIELD | Attending: Anesthesiology | Primary: Anesthesiology

## 2024-04-11 ENCOUNTER — Inpatient Hospital Stay: Admit: 2024-04-11 | Discharge: 2024-04-11 | Payer: BLUE CROSS/BLUE SHIELD

## 2024-04-11 ENCOUNTER — Encounter: Admit: 2024-04-11 | Discharge: 2024-04-11 | Payer: BLUE CROSS/BLUE SHIELD

## 2024-04-11 MED ADMIN — propofol (DIPRIVAN) infusion 10 mg/mL: INTRAVENOUS | @ 19:00:00 | Stop: 2024-04-11

## 2024-04-11 MED ADMIN — Propofol (DIPRIVAN) injection: INTRAVENOUS | @ 19:00:00 | Stop: 2024-04-11

## 2024-04-11 NOTE — Unmapped (Signed)
 The [facility] has made a second and final attempt to reach this patient to refill the following medication:Dupixent .      We have left voicemails on the following phone numbers: [phone], have been unable to leave messages on the following phone numbers: [phone], have sent a MyChart message, and have sent a text message to the following phone numbers: [phone].    Dates contacted: 7/24 & 7/31  Last scheduled delivery: 03/13/24 (28 day supply)    The patient may be at risk of non-compliance with this medication. The patient should call the [facility] at [phone]  Option 4, then Option 2: Dermatology, Gastroenterology, Rheumatology to refill medication.    Brett Anderson   [facility] and Home Delivery Pharmacy Specialty Technician

## 2024-04-17 NOTE — Unmapped (Signed)
 Contacted pt to review results of recent EGD/dil/bx.  He tolerated the procedure well.  He is in agreement with the plan below and he will contact me in the interim with any issues.  (Of note, the pt goes by Honora)      Date of EGD Treatment pre-EGD Symptom response EGD findings (EREFS) Eosinophil counts Plan    Steroids Diet PPI   Proximal Distal    04/11/24 --    Dupi 300 weekly -- omep 20 daily yes Ex1, R0.5, E1, F1, S0  14 6 This his best response in years from the histologic standpoint, and stricture has resolved and he feels well.  Still some distal EoE endo findings, but overall this is a great response.  He will c/w the dupi 300mg  weely as medically indicated and will f/u in clinic in 1 year and have a repeat EGD in 2 yrs if things continue to go well.   08/11/22 --    Dupi 300 weekly -- omep 20 daily yes Ex0.5, R0.5, E1, F1, S20  4 57 This is a very good result with the dupi, with demonstrable improvements in symptoms (basically no more dysphagia), histology, and endoscopic severity.  Given this, it is medically indicated that he will c/w this med at 300mg  weekly, and have repeat EGD in 6 mos.   09/23/21 Comp flutic 4mg  QHS -- omep 20 daily +/- Ex2, R1, E1, F1, S0 32 100 This is ongoing marked disease activity despite now using the even higher dose of the tCS.  Given this non-response to steroids, prior non-response to PPI, and history of strictures needing intermittent dilation, I believe dupilumab  is indicated.  I have discussed this in detail with the pt and his father and they are thinking about if they would like to pursue this option.    ADDENDUM - the pt will try dupilumab  - will start the approval process and then plan EGD ~ 6mos after starting the med to assess initial response.   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  We discussed options and he'd like to double up the dose to 60ml=4mg  QHS, and then we'll repeat EGD in 3 mos.  If he's is not responding at that time, we'll need to look towards dupi.  In addition, I've placed a referral to ENT at pt/family's request to see if his nocturnal awakening and gasping episodes could be related to laryngospasm.   11/15/2018 Comp flutic 2 mg QHS -- daily yes Ex0, R0, E1, F1, S0    (no stricture noted on pull-through) 3 37 This is a much improved response, and while not as good as when on the flutic vs OVB study (where he got flutic), it is similar to 2 procedures ago and I think a good place to c/w the treatment as prescribed currently.  He will c/w comp flutic 2mg  QHS and omep 20 daily, will monitor sx, and will return to GI clinic for a f/u appt in the fall when convenient for him.  Could consider repeat scope next winter.   08/02/18 Comp flutic, but not taking the correct dose -- daily no Ex1, R1, E1, F2, S20 40 150 Things are clearly flared up, but  in talking with the patients father, it sounds like the pt was not doing the correct dose each day (maybe half or less), so the plan will be to aim for the correct dose (86ml=2mg  qhs) and repeat the EGD in ~ 3 mos.  This would explain the discrepancy between the prior effect of the flovent  MDI in the study and this result.  If this doesn't work, would change back to OVB or else consider a trial of a biologic.   03/30/17 Comp OVB 2 mg QHS -- daily  Ex0, R0, E1, F1, S0 1 45 This an improvement, but not as good as after the study.  I think I'd like him to c/w the compounded OVB for now, especially given the sx and endoscopic improvement (w/ resolution of stricture).  They would like to try a local comp pharmacy, so new rx called into [facility] - [phone].  Given the formulation change, would like to repeat EGD/bx before the end of the year.   12/22/16 -- -- daily yes Ex2, R1, E1, F2, S16 75 65 While the pt continues to have few sx due to prior dilation, the EoE is clearly flaring right now.  Given the prior response to tCS, will start compounded OVB 2mg  QHS (for ease of administration) and plan repeat EGD +/- dilation in ~ 3 mos.   12/03/15 OVB vs flutic study -- BID Yes Ex0, R0, E1, F1, S17    TTS to 18 0 10 This is a great response to study drug.  At this point, he is finished with the treatment phase of the study, and after considering options, the pt and parents would like to enter the observation phase, and he will have follow-up as per protocol.  While I think he can decrease PPI to daily dosing, they'd like to maintain this unchanged for now, which is ok.   07/24/15 -- -- BID No Ex2, R2, E1, F2, S12     TTS to 15 65 >200 This confirms a dx of EoE.  We discussed the treatment approaches in detail, and they have elected to do the OVB vs fluticasone  study.  We will set up the logistics of this and he will have f/u as per study protocol.

## 2024-04-18 NOTE — Unmapped (Signed)
 [facility] and [facility] Delivery Pharm[facility] Refill Coordination Note    Specialty Medication(s) to be Shipped:   Inflammatory Disorders: Dupixent     Other medication(s) to be shipped: No additional medications requested for fill at this time     Brett Anderson, DOB: 05-Jun-2001  Phone: [phone] (home) [phone] (work)      All above HIPAA information was verified with patient.     Was a Nurse, learning disability used for this call? No    Completed refill call assessment today to schedule patient's medication shipment from the [facility] Delivery Pharm[facility]  [phone]).  All relevant notes have been reviewed.     Specialty medication(s) and dose(s) confirmed: Regimen is correct and unchanged.   Changes to medications: Brett Anderson reports no changes at this time.  Changes to insurance: No  New side effects reported not previously addressed with a pharmacist or physician: None reported  Questions for the pharmacist: No    Confirmed patient received a Conservation officer, historic buildings and a Surveyor, mining with first shipment. The patient will receive a drug information handout for each medication shipped and additional FDA Medication Guides as required.       DISEASE/MEDICATION-SPECIFIC INFORMATION        For patients on injectable medications: Patient currently has 1 doses left.  Next injection is scheduled for 8/8.    SPECIALTY MEDICATION ADHERENCE     Medication Adherence    Patient reported X missed doses in the last month: 0  Specialty Medication: DUPIXENT  PEN 300 mg/2 mL  Patient is on additional specialty medications: No              Were doses missed due to medication being on hold? No    Dupixent  300/2 mg/ml: 1 doses of medicine on hand        REFERRAL TO PHARMACIST     Referral to the pharmacist: Not needed      [facility]     Shipping address confirmed in Epic.     Cost and Payment: Patient has a $0 copay, payment information is not required.    Delivery Scheduled: Yes, Expected medication delivery date: 04/24/24. Medication will be delivered via UPS to the prescription address in Epic WAM.    [name]   [facility] Special[facility] and Home Delivery Pharmacy  Specialty Technician

## 2024-04-23 MED FILL — DUPIXENT 300 MG/2 ML SUBCUTANEOUS PEN INJECTOR: SUBCUTANEOUS | 28 days supply | Qty: 8 | Fill #1

## 2024-05-17 NOTE — Unmapped (Signed)
 [facility] Specialty and Home Delivery Pharmacy Refill Coordination Note    Specialty Medication(s) to be Shipped:   Inflammatory Disorders: Dupixent     Other medication(s) to be shipped: No additional medications requested for fill at this time    Specialty Medications not needed at this time: N/A     Brett Anderson, DOB: July 30, 2001  Phone: [phone] (home) [phone] (work)      All above HIPAA information was verified with patient.     Was a Nurse, learning disability used for this call? No    Completed refill call assessment today to schedule patient's medication shipment from the [facility] and Home Delivery Pharmacy  [phone]).  All relevant notes have been reviewed.     Specialty medication(s) and dose(s) confirmed: Regimen is correct and unchanged.   Changes to medications: [name] reports no changes at this time.  Changes to insurance: No  New side effects reported not previously addressed with a pharmacist or physician: None reported  Questions for the pharmacist: No    Confirmed patient received a Conservation officer, historic buildings and a Surveyor, mining with first shipment. The patient will receive a drug information handout for each medication shipped and additional FDA Medication Guides as required.       DISEASE/MEDICATION-SPECIFIC INFORMATION        For patients on injectable medications: Next injection is scheduled for 9/8.    SPECIALTY MEDICATION ADHERENCE     Medication Adherence    Patient reported X missed doses in the last month: 0  Specialty Medication: DUPIXENT  PEN 300 mg/2 mL  Patient is on additional specialty medications: No              Were doses missed due to medication being on hold? No    Dupixent  300/2 mg/ml: 1 doses of medicine on hand        REFERRAL TO PHARMACIST     Referral to the pharmacist: Not needed      [facility]     Shipping address confirmed in Epic.     Cost and Payment: Patient has a $0 copay, payment information is not required.    Delivery Scheduled: Yes, Expected medication delivery date: 05/23/24.     Medication will be delivered via UPS to the prescription address in Epic WAM.    [name]   [facility] Specialty and Home Delivery Pharmacy  Specialty Technician

## 2024-05-22 MED FILL — DUPIXENT 300 MG/2 ML SUBCUTANEOUS PEN INJECTOR: SUBCUTANEOUS | 28 days supply | Qty: 8 | Fill #2

## 2024-06-13 NOTE — Unmapped (Signed)
 [facility] Refill Coordination Note    Specialty Medication(s) to be Shipped:   Inflammatory Disorders: Dupixent     Other medication(s) to be shipped: No additional medications requested for fill at this time    Specialty Medications not needed at this time: N/A     Brett Anderson, DOB: [birth year]  Phone: [phone] (home) [phone] (work)      All above HIPAA information was verified with patient.     Was a Nurse, learning disability used for this call? No    Completed refill call assessment today to schedule patient's medication shipment from the [facility] Delivery Pharm[facility]  [phone]).  All relevant notes have been reviewed.     Specialty medication(s) and dose(s) confirmed: Regimen is correct and unchanged.   Changes to medications: [name] reports no changes at this time.  Changes to insurance: No  New side effects reported not previously addressed with a pharmacist or physician: None reported  Questions for the pharmacist: No    Confirmed patient received a Conservation officer, historic buildings and a Surveyor, mining with first shipment. The patient will receive a drug information handout for each medication shipped and additional FDA Medication Guides as required.       DISEASE/MEDICATION-SPECIFIC INFORMATION        For patients on injectable medications: Next injection is scheduled for 10/2.    SPECIALTY MEDICATION ADHERENCE     Medication Adherence    Patient reported X missed doses in the last month: 0  Specialty Medication: DUPIXENT  PEN 300 mg/2 mL  Patient is on additional specialty medications: No              Were doses missed due to medication being on hold? No    Dupixent  300/2 mg/ml: 1 doses of medicine on hand         REFERRAL TO PHARMACIST     Referral to the pharmacist: Not needed      [facility]     Shipping address confirmed in Epic.     Cost and Payment: Patient has a $0 copay, payment information is not required.    Delivery Scheduled: Yes, Expected medication delivery date: 06/19/24.     Medication will be delivered via UPS to the prescription address in Epic WAM.    [name]   [facility]  Specialty Technician

## 2024-06-18 MED FILL — DUPIXENT 300 MG/2 ML SUBCUTANEOUS PEN INJECTOR: SUBCUTANEOUS | 28 days supply | Qty: 8 | Fill #3

## 2024-07-18 NOTE — Progress Notes (Signed)
 The [facility] has made a second and final attempt to reach this patient to refill the following medication:Dupixent .      We have left voicemails on the following phone numbers: [phone], have been unable to leave messages on the following phone numbers: [phone], have sent a MyChart message, and have sent a text message to the following phone numbers: [phone].    Dates contacted: 10/29 & 11/6  Last scheduled delivery: 06/18/24 (28 day supply)    The patient may be at risk of non-compliance with this medication. The patient should call the [facility] at [phone]  Option 4, then Option 2: Dermatology, Gastroenterology, Rheumatology to refill medication.    [name]   [facility] Specialty Technician

## 2024-07-31 DIAGNOSIS — K200 Eosinophilic esophagitis: Principal | ICD-10-CM

## 2024-07-31 MED ORDER — OMEPRAZOLE 20 MG CAPSULE,DELAYED RELEASE
ORAL_CAPSULE | Freq: Every day | ORAL | 3 refills | 90.00000 days | Status: CP
Start: 2024-07-31 — End: 2024-07-31

## 2024-07-31 NOTE — Telephone Encounter (Signed)
 Last EGD with Dr. Doris 03/2024 with plans to continue present meds and rtc in 1 year, repeat EGD in 2 years. Omeprazole  20 mg daily refilled per policy.

## 2024-08-12 NOTE — Progress Notes (Signed)
 [facility] Refill Coordination Note    Specialty Medication(s) to be Shipped:   Inflammatory Disorders: Dupixent     Other medication(s) to be shipped: No additional medications requested for fill at this time    Specialty Medications not needed at this time: N/A     Brett Anderson, DOB: 03-20-2001  Phone: [phone] (home) [phone] (work)      All above HIPAA information was verified with patient.     Was a nurse, learning disability used for this call? No    Completed refill call assessment today to schedule patient's medication shipment from the [facility]  [phone]).  All relevant notes have been reviewed.     Specialty medication(s) and dose(s) confirmed: Regimen is correct and unchanged.   Changes to medications: Selma reports no changes at this time.  Changes to insurance: No  New side effects reported not previously addressed with a pharmacist or physician: None reported  Questions for the pharmacist: No    Confirmed patient received a Conservation Officer, Historic Buildings and a Surveyor, Mining with first shipment. The patient will receive a drug information handout for each medication shipped and additional FDA Medication Guides as required.       DISEASE/MEDICATION-SPECIFIC INFORMATION        For patients on injectable medications: Next injection is scheduled for 12/02.    SPECIALTY MEDICATION ADHERENCE     Medication Adherence    Patient reported X missed doses in the last month: 0  Specialty Medication: DUPIXENT  PEN 300 mg/2 mL pen injector (dupilumab )  Patient is on additional specialty medications: No              Were doses missed due to medication being on hold? No    DUPIXENT  PEN 300 mg/2 mL pen injector (dupilumab ): 1 doses of medicine on hand     REFERRAL TO PHARMACIST     Referral to the pharmacist: Not needed      [facility]     Shipping address confirmed in Epic.     Cost and Payment: Patient has a $0 copay, payment information is not required.    Delivery Scheduled: Yes, Expected medication delivery date: 12/04.     Medication will be delivered via UPS to the prescription address in Epic WAM.    Britini Garcilazo   [place] [facility]  Specialty Technician

## 2024-08-14 MED FILL — DUPIXENT 300 MG/2 ML SUBCUTANEOUS PEN INJECTOR: SUBCUTANEOUS | 28 days supply | Qty: 8 | Fill #4

## 2024-09-10 NOTE — Progress Notes (Addendum)
 [facility] Delivery Pharm[facility] Refill Coordination Note    Sent MyChart message for pt to reply to it with new 2026 insurance    Specialty Medication(s) to be Shipped:   Inflammatory Disorders: Dupixent     Other medication(s) to be shipped: No additional medications requested for fill at this time    Specialty Medications not needed at this time: N/A     Brett Anderson, DOB: [birth year]  Phone: [phone] (home) [phone] (work)      All above HIPAA information was verified with patient.     Was a nurse, learning disability used for this call? No    Completed refill call assessment today to schedule patient's medication shipment from the [facility]  [phone]).  All relevant notes have been reviewed.     Specialty medication(s) and dose(s) confirmed: Regimen is correct and unchanged.   Changes to medications: Brett Anderson reports no changes at this time.  Changes to insurance: No  New side effects reported not previously addressed with a pharmacist or physician: None reported  Questions for the pharmacist: No    Confirmed patient received a Conservation Officer, Historic Buildings and a Surveyor, Mining with first shipment. The patient will receive a drug information handout for each medication shipped and additional FDA Medication Guides as required.       DISEASE/MEDICATION-SPECIFIC INFORMATION        N/A    SPECIALTY MEDICATION ADHERENCE     Medication Adherence    Patient reported X missed doses in the last month: 0  Specialty Medication: Dupixent   Patient is on additional specialty medications: No  Informant: patient              Were doses missed due to medication being on hold? No    Dupixent  300 mg/68mL: 0 doses of medicine on hand      Specialty medication is an injection or given on a cycle: Yes, Next injection is scheduled for 1/6.    REFERRAL TO PHARMACIST     Referral to the pharmacist: Not needed      [facility]     Shipping address confirmed in Epic.     Cost and Payment: Unable to determine copay at this time as the prescription requires a prior authorization/financial assistance. Patient is aware that shipment will be held until copay has been approved and payment information collected, if needed.    Delivery Scheduled: Yes, Expected medication delivery date: 1/6.     Medication will be delivered via UPS to the prescription address in Epic WAM.    Harlene Brett Anderson, PharmD   [facility] Pharm[facility]  Specialty Pharmacist

## 2024-09-16 MED FILL — DUPIXENT 300 MG/2 ML SUBCUTANEOUS PEN INJECTOR: SUBCUTANEOUS | 28 days supply | Qty: 8 | Fill #5

## 2024-09-30 DIAGNOSIS — K200 Eosinophilic esophagitis: Principal | ICD-10-CM

## 2024-09-30 DIAGNOSIS — R1310 Dysphagia, unspecified: Principal | ICD-10-CM

## 2024-09-30 MED ORDER — DUPIXENT 300 MG/2 ML SUBCUTANEOUS PEN INJECTOR
SUBCUTANEOUS | 5 refills | 28.00000 days
Start: 2024-09-30 — End: 2025-09-30

## 2024-10-03 DIAGNOSIS — K200 Eosinophilic esophagitis: Principal | ICD-10-CM

## 2024-10-03 DIAGNOSIS — R1310 Dysphagia, unspecified: Principal | ICD-10-CM

## 2024-10-03 MED ORDER — DUPIXENT 300 MG/2 ML SUBCUTANEOUS PEN INJECTOR
SUBCUTANEOUS | 11 refills | 28.00000 days | Status: CP
Start: 2024-10-03 — End: 2025-10-03

## 2024-10-03 NOTE — Progress Notes (Signed)
 The [facility] has received the prescription(s) for DUPIXENT  PEN 300 mg/2 mL pen injector (dupilumab ). The triage team has completed the investigation and has determined that the patient is NOT able to fill this medication at the [facility] due to insurance plan limitations. Please see additional information below and re-route the prescription to the preferred pharmacy. Thank you.    PA Required: Yes    Specialty Pharmacy Required:  [facility] - Phone: [phone]     Please note that due to Board of Pharmacy restrictions, we are unable to forward this prescription to a different pharmacy. The provider will need to send a new prescription.

## 2024-10-03 NOTE — Progress Notes (Signed)
 Sending Dupixent  rx to [facility] per benefits investigation. Message to pt with this update . PA needed per [facility].

## 2024-10-03 NOTE — Addendum Note (Signed)
 Addended by: Estela Vinal on: 10/03/2024 11:11 AM     Modules accepted: Orders

## 2024-10-03 NOTE — Progress Notes (Signed)
 Specialty Medication(s): Dupixent     Brett Anderson has been dis-enrolled from the [name] specialty pharmacy services as a result of a pharmacy change resulting from insurance limitations. The insurance company requires the patient fill at [facility].    Additional information provided to the patient: n/a    Avel Ogawa A Kerah Hardebeck, PharmD  [facility] Specialty Pharmacist
